# Patient Record
Sex: Male | Born: 1950 | Race: White | Hispanic: No | Marital: Married | Smoking: Never smoker
Health system: Southern US, Academic
[De-identification: ages and names within clinical notes are randomized; demographics above are authoritative.]

## PROBLEM LIST (undated history)

## (undated) DIAGNOSIS — E7800 Pure hypercholesterolemia, unspecified: Secondary | ICD-10-CM

## (undated) DIAGNOSIS — E039 Hypothyroidism, unspecified: Secondary | ICD-10-CM

## (undated) DIAGNOSIS — F419 Anxiety disorder, unspecified: Secondary | ICD-10-CM

## (undated) DIAGNOSIS — G912 (Idiopathic) normal pressure hydrocephalus: Secondary | ICD-10-CM

## (undated) DIAGNOSIS — E785 Hyperlipidemia, unspecified: Secondary | ICD-10-CM

## (undated) DIAGNOSIS — K219 Gastro-esophageal reflux disease without esophagitis: Secondary | ICD-10-CM

## (undated) DIAGNOSIS — E119 Type 2 diabetes mellitus without complications: Secondary | ICD-10-CM

## (undated) HISTORY — DX: Gastro-esophageal reflux disease without esophagitis: K21.9

## (undated) HISTORY — PX: HX TONSILLECTOMY: SHX27

## (undated) HISTORY — DX: Type 2 diabetes mellitus without complications: E11.9

## (undated) HISTORY — DX: Anxiety disorder, unspecified: F41.9

## (undated) HISTORY — PX: HX APPENDECTOMY: SHX54

## (undated) HISTORY — DX: Hyperlipidemia, unspecified: E78.5

## (undated) HISTORY — DX: (Idiopathic) normal pressure hydrocephalus: G91.2

## (undated) HISTORY — DX: Hypothyroidism, unspecified: E03.9

## (undated) HISTORY — PX: HX COLONOSCOPY: 2100001147

## (undated) HISTORY — DX: Pure hypercholesterolemia, unspecified: E78.00

---

## 2001-08-06 ENCOUNTER — Emergency Department: Payer: Self-pay

## 2006-09-26 ENCOUNTER — Ambulatory Visit (INDEPENDENT_AMBULATORY_CARE_PROVIDER_SITE_OTHER): Payer: Medicare Other | Admitting: Neurology

## 2010-01-26 ENCOUNTER — Encounter (HOSPITAL_BASED_OUTPATIENT_CLINIC_OR_DEPARTMENT_OTHER): Payer: Medicare Other

## 2010-03-30 ENCOUNTER — Encounter (INDEPENDENT_AMBULATORY_CARE_PROVIDER_SITE_OTHER): Payer: Medicare Other

## 2010-06-01 ENCOUNTER — Encounter (HOSPITAL_BASED_OUTPATIENT_CLINIC_OR_DEPARTMENT_OTHER): Payer: Medicare Other

## 2010-07-06 ENCOUNTER — Encounter (INDEPENDENT_AMBULATORY_CARE_PROVIDER_SITE_OTHER): Payer: Medicare Other

## 2010-08-10 ENCOUNTER — Encounter (INDEPENDENT_AMBULATORY_CARE_PROVIDER_SITE_OTHER): Payer: Medicare Other

## 2010-09-21 ENCOUNTER — Encounter (INDEPENDENT_AMBULATORY_CARE_PROVIDER_SITE_OTHER): Payer: Medicare Other

## 2010-12-21 ENCOUNTER — Encounter (HOSPITAL_BASED_OUTPATIENT_CLINIC_OR_DEPARTMENT_OTHER): Payer: Medicare Other

## 2011-01-04 ENCOUNTER — Encounter (HOSPITAL_BASED_OUTPATIENT_CLINIC_OR_DEPARTMENT_OTHER): Payer: Self-pay

## 2011-01-04 ENCOUNTER — Ambulatory Visit (INDEPENDENT_AMBULATORY_CARE_PROVIDER_SITE_OTHER): Payer: Medicare Other

## 2011-01-04 NOTE — Patient Instructions (Signed)
 Continue above regimen of medications. Try talking melatonin 2-3 hrs before bedtime. Change Buspar  night dose to evening. Follow sleep hygiene measures and consider psychotherapy. Will discuss CBT vs supportive therapy as options in future. Call for any worsening mood symptoms or any change in overall functioning. Crisis plan 911/ER discussed with pt. RTC x 3 months or sooner if needed. Seen and discussed with Dr. Auston.

## 2011-01-04 NOTE — Progress Notes (Addendum)
Vincent Powell ([id number])  02/26/1951    01/04/2011      S:   Patient presents for follow up of his psychiatric problems. Reports being well on his mood and anxiety. No new symptoms or complaints. Continues to have intermittent episodes of depression and associated sleep problems mainly falling asleep. Reports stress makes his symptoms worse but talking with his pastor helps. Has problems with his wife and is having hard managing his things with her. No suicidal thoughts. No obvious manic or hypomanic symptoms. Dyskinetic symptoms fairly well in control. No new affective symptoms.  Sleep/Appetite/Weight: Same and somewhat better.  Psychiatric ROS: No psychosis. Anxiety about multiple stressors.   Recent stressors: Marital discord.   Side effects to meds: None so far    O:   Awake, Alert, Oriented X 3, cooperative, good eye contact, casual attire, good hygiene and grooming, dressing appropriate, Psychomotor activity normal, Gait normal. Speech fast, normal tone and volume. Mood reported as 'Good'. Affect reactive and restricted but euthymic. TP is logical, coherent and Goal Directed. TC devoid of Delusions/paranoia. No SI/HI reported currently. No AVH or other perceptual disturbances. Memory/Abstraction/Fund of Knowledge Good. Attention and concentration fair. Language/Naming/Repetition good. Executive functioning normal. Insight and Judgment Fair.     ASSESSMENT:  1. Bipolar-I, Depressed, Mild but stable.  2. Anxiety Disorder NOS, Stable      PLAN: Continue above regimen of medications. Change night time melatonin and Buspar to evening at around 7 pm to see if any changes. Sleep hygiene measures to improve dysregulation. CBT vs supportive therapy in future if symptoms continue to persist. Call voicemail for any worsening mood symptoms or any change in overall functioning. Crisis plan 911/ER discussed with pt. RTC x 3 months or sooner if needed.    ATTENDING NOTE:   I saw and examined the patient on 01/04/11.  I reviewed the resident's note.  I agree with the findings and plan of care as documented in the resident's note.   Randolph Bing, MD 01/05/2011, 8:53 AM

## 2011-04-05 ENCOUNTER — Encounter (HOSPITAL_BASED_OUTPATIENT_CLINIC_OR_DEPARTMENT_OTHER): Payer: Medicare Other

## 2011-04-12 ENCOUNTER — Encounter (HOSPITAL_BASED_OUTPATIENT_CLINIC_OR_DEPARTMENT_OTHER): Payer: Self-pay

## 2011-04-12 ENCOUNTER — Ambulatory Visit (INDEPENDENT_AMBULATORY_CARE_PROVIDER_SITE_OTHER): Payer: Medicare Other

## 2011-04-12 VITALS — BP 128/65 | HR 72 | Resp 18 | Ht 70.0 in | Wt 224.0 lb

## 2011-04-12 NOTE — Patient Instructions (Addendum)
Continue above regimen of medications. Can try to wean Valium to off. Supportive psychotherapy each visit for family stress. Call for any worsening mood symptoms or any change in overall functioning. Crisis plan 911/ER discussed with pt. RTC x 3 months or sooner if needed. Seen and discussed with Dr. Daine Gravel

## 2011-04-12 NOTE — Progress Notes (Addendum)
Patient Name: Vincent Powell  (MRN [id number])  Date Of Birth: 06-06-1950  Date of Service: 04/12/2011      S:   Patient presents for follow up of his psychiatric problems. Reports being more anxious and worried about all small things in his life. Small things in everyday life is worrying him and feels anxious about it. He says home situation is not doing well and having lot of arguments with life. He is worried. No sleep and appetite problems. Gained some weight. Able to do ADLs with some difficulty. His motivation and energy are fairly well, not down or depressed. Also having some memory deficits overall. Still able to do daily chores without any problems. No obvious mania or psychosis. No SI/HI.   Sleep/Appetite/Weight: Unchanged but able to   Psychiatric ROS: Anxiety present. No Nightmares.  Recent stressors: Interpersonal problems with spouse.  Side effects to meds: None    O:  BP 128/65   Pulse 72   Resp 18   Ht 1.778 m (5\' 10" )   Wt 101.606 kg (224 lb)   BMI 32.14 kg/m2  Awake, Alert, Oriented X 3, cooperative, good eye contact, casual attire, good hygiene and grooming, dressing appropriate, Psychomotor activity normal, Gait normal. Speech normal rate, tone and volume. Mood reported as 'Fair'. Affect reactive and restricted but euthymic. TP is logical, coherent and Goal Directed. TC devoid of Delusions/paranoia. No SI/HI reported currently. No AVH or other perceptual disturbances. Memory/Abstraction/Fund of Knowledge Good. Attention and concentration fair. Language/Naming/Repetition good. Executive functioning normal. Insight and Judgment Fair.     ASSESSMENT:  1. Bipolar-I, Depressed, Mild but stable.   2. Anxiety Disorder NOS, Stable    Current Outpatient Prescriptions   Medication Sig   . Lamotrigine (LAMICTAL) 200 mg Oral Tablet take 200 mg by mouth Once a day.     . busPIRone (BUSPAR) 15 mg Oral Tablet take 15 mg by mouth Three times a day.      . diazepam (VALIUM) 5 mg Oral Tablet take 5 mg by mouth every night.     . Melatonin 3 mg Oral Tablet take 9 mg by mouth every night.     . vitamin E 400 unit Oral Capsule take 400 Units by mouth Twice daily.           PLAN: Continue above regimen of medications. Can try to decrease and keep Valium as PRN dosing for sleep and muscle spasm. Validated current psychosocial situation with wife and offered supportive psychotherapy. Doing well talking with pastor.  Call voicemail for any worsening mood symptoms or any change in overall functioning. Crisis plan 911/ER discussed with pt. RTC x 3 months or sooner if needed.        I saw and examined the patient on 04/12/2011 with Willa Rough, MD.    I reviewed the resident's note.  I agree with the findings including assessment and plan as documented in the resident's note.  Any exceptions/additions are noted .    Improved adaptive coping with life stressors.More self confidant.mood stable.  Darcel Bayley, MD 04/13/2011, 4:22 PM

## 2011-05-30 ENCOUNTER — Other Ambulatory Visit (HOSPITAL_BASED_OUTPATIENT_CLINIC_OR_DEPARTMENT_OTHER): Payer: Self-pay

## 2011-05-30 MED ORDER — LAMOTRIGINE 200 MG TABLET
200.0000 mg | ORAL_TABLET | Freq: Every day | ORAL | Status: DC
Start: 2011-05-30 — End: 2011-08-16

## 2011-05-30 MED ORDER — BUSPIRONE 15 MG TABLET
15.0000 mg | ORAL_TABLET | Freq: Three times a day (TID) | ORAL | Status: DC
Start: 2011-05-30 — End: 2011-06-21

## 2011-05-30 NOTE — Telephone Encounter (Signed)
Called by patient for refill on Lamictal and Buspar. Called in 6 month supply and left message on pts voicemail.

## 2011-06-14 ENCOUNTER — Other Ambulatory Visit (HOSPITAL_BASED_OUTPATIENT_CLINIC_OR_DEPARTMENT_OTHER): Payer: Self-pay

## 2011-06-14 MED ORDER — DIAZEPAM 5 MG TABLET
5.0000 mg | ORAL_TABLET | Freq: Every evening | ORAL | Status: DC | PRN
Start: 2011-06-14 — End: 2011-08-16

## 2011-06-14 NOTE — Telephone Encounter (Signed)
Called in Valium 5 mg qhs prn for spasm and anxiety/sleep. Asked pt not to take it routinely if possible.

## 2011-06-21 ENCOUNTER — Encounter (HOSPITAL_BASED_OUTPATIENT_CLINIC_OR_DEPARTMENT_OTHER): Payer: Self-pay

## 2011-06-21 ENCOUNTER — Ambulatory Visit (INDEPENDENT_AMBULATORY_CARE_PROVIDER_SITE_OTHER): Payer: Medicare Other

## 2011-06-21 VITALS — BP 124/74 | HR 74 | Resp 18 | Ht 70.0 in | Wt 221.0 lb

## 2011-06-21 MED ORDER — BUSPIRONE 30 MG TABLET
30.0000 mg | ORAL_TABLET | Freq: Two times a day (BID) | ORAL | Status: DC
Start: 2011-06-21 — End: 2011-10-18

## 2011-06-21 NOTE — Progress Notes (Addendum)
Patient Name: Vincent Powell  (MRN [id number])  Date Of Birth: Jul 30, 1950  Date of Service: 06/21/2011      S:   Patient presents for follow up of his psychiatric problems. Reports being well on his mood and anxiety. No new symptoms or complaints. [name] reports having some depression with associated memory and concentration problems. He reports forgetting names and sometimes reasons of some activities. He is not having any bowel/bladder problems. He reports having good energy, able to go out and do lawn work. Talks with his pastor about problems. He has marital problems with his wife. His relationship with her is strained. Feels he cannot fix that part of his life. No SI/HI. Has random racing thoughts in the night but able to get 5-6 hours sleep eventually.  Sleep/Appetite/Weight: Appetite and weight unchanged good.  Psychiatric ROS: Anxiety present but no physical symptoms associated with it.  Recent stressors: Marital discord  Side effects to meds: None so far    O:  BP 124/74   Pulse 74   Resp 18   Ht 1.778 m (5\' 10" )   Wt 100.245 kg (221 lb)   BMI 31.71 kg/m2  Awake, Alert, Oriented X 3, cooperative, good eye contact, casual attire, good hygiene and grooming, dressing appropriate, Psychomotor activity normal, Gait normal. Speech normal rate, tone and volume. Mood reported as 'Good'. Affect reactive and euthymic. TP is logical, coherent and Goal Directed. TC devoid of Delusions/paranoia. No SI/HI reported currently. No AVH or other perceptual disturbances. Memory/Abstraction/Fund of Knowledge Good. Attention and concentration fair to poor. Language/Naming/Repetition good. Executive functioning normal. Insight and Judgment Fair. MMSE today 25/30 with trouble with recollecting 2/3 and attention problems.    ASSESSMENT:  1. Anxiety disorder (300.00)  busPIRone (BUSPAR) 30 mg Oral Tablet   2. Bipolar 1 disorder, depressed, mild (296.51)     3. 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 Mild cognitive impairment (331.83)  VITAMIN B12, FOLATE, THYROID STIMULATING HORMONE (SENSITIVE TSH), VITAMIN B12, FOLATE, THYROID STIMULATING HORMONE (SENSITIVE TSH)     Current Outpatient Prescriptions   Medication Sig   . busPIRone (BUSPAR) 30 mg Oral Tablet Take 1 Tab (30 mg total) by mouth Twice daily   . diazepam (VALIUM) 5 mg Oral Tablet Take 1 Tab (5 mg total) by mouth Every night as needed for Anxiety   . Lamotrigine (LAMICTAL) 200 mg Oral Tablet Take 1 Tab (200 mg total) by mouth Once a day   . DISCONTD: busPIRone (BUSPAR) 15 mg Oral Tablet Take 1 Tab (15 mg total) by mouth Three times a day   . Melatonin 3 mg Oral Tablet take 9 mg by mouth every night.     . vitamin E 400 unit Oral Capsule take 400 Units by mouth Twice daily.           PLAN: Continue above regimen of medications. MMSE stable between last visit and now. Pt feels it is going down. Will assess for B12, folate and TSH. Low threshold to start cholinesterase inhibitors if worsening over next few visits. MMSE every visit. Call voicemail for any worsening mood symptoms or any change in overall functioning. Crisis plan 911/ER discussed with pt. RTC x 2 months or sooner if needed.          I saw and examined the patient on 06/21/2011.  I reviewed the resident's note.  I agree with the findings and plan of care as documented in the resident's note.  Any exceptions/additions are edited/noted.  Clinton Quant, MD 06/22/2011, 8:13 AM

## 2011-06-21 NOTE — Patient Instructions (Signed)
 Continue above regimen of medications. Change Buspar  frequency to BID with keeping total dose same per pt request. Check B12, Folate and TSH for memory and concentration decline. MMSE every visit. Call for any worsening mood symptoms or any change in overall functioning. Crisis plan 911/ER discussed with pt. RTC x 2 months or sooner if needed. Seen and discussed with Dr. Arvella.

## 2011-07-17 ENCOUNTER — Telehealth (HOSPITAL_BASED_OUTPATIENT_CLINIC_OR_DEPARTMENT_OTHER): Payer: Self-pay

## 2011-07-17 NOTE — Telephone Encounter (Signed)
Pt reports that he has been having worsening depression and feeling really down. Interrupted but adequate sleep. Appetite normal. Energy and memory poor. Overall worst since last 3-4 years. Not SI now. Commits to safety. Discussed importance to going to ER or call 911 if SI. Pt understands, will consider using low dose abilify if not improving. Will also consider switching from Buspar to SNRI like cymbalta in future.

## 2011-08-16 ENCOUNTER — Ambulatory Visit (INDEPENDENT_AMBULATORY_CARE_PROVIDER_SITE_OTHER): Payer: Medicare Other

## 2011-08-16 ENCOUNTER — Encounter (HOSPITAL_BASED_OUTPATIENT_CLINIC_OR_DEPARTMENT_OTHER): Payer: Self-pay

## 2011-08-16 VITALS — BP 132/77 | HR 65 | Resp 18 | Ht 70.0 in | Wt 220.0 lb

## 2011-08-16 MED ORDER — DIAZEPAM 5 MG TABLET
5.0000 mg | ORAL_TABLET | Freq: Every evening | ORAL | Status: DC
Start: 2011-08-16 — End: 2011-11-22

## 2011-08-16 MED ORDER — MELATONIN 3 MG TABLET
9.0000 mg | ORAL_TABLET | Freq: Every evening | ORAL | Status: DC
Start: 2011-08-16 — End: 2012-11-05

## 2011-08-16 MED ORDER — LAMOTRIGINE 150 MG TABLET
150.0000 mg | ORAL_TABLET | Freq: Every day | ORAL | Status: DC
Start: 2011-08-16 — End: 2011-11-22

## 2011-08-16 MED ORDER — LAMOTRIGINE 100 MG TABLET
100.00 mg | ORAL_TABLET | Freq: Every evening | ORAL | Status: DC
Start: 2011-08-16 — End: 2011-12-20

## 2011-08-16 NOTE — Patient Instructions (Signed)
Continue above regimen of medications. Increase Lamictal 150 mg in AM and 100mg  qhs. Continue diazepam 5 mg qhs and call for any problems. Call for any worsening mood symptoms or any change in overall functioning. Crisis plan 911/ER discussed with pt. RTC x 3 weeks- 1 months or sooner if needed. Seen and discussed with Dr.

## 2011-08-16 NOTE — Progress Notes (Addendum)
Patient Name: Vincent Powell  (MRN [id number])  Date Of Birth: [birth year]  Date of Service: 08/16/2011      S:   Patient presents for follow up of his psychiatric problems. Mr Tiley continues to be down on his mood for a few days and in between noticed to have some increased anxiety and racing thoughts. Difficulty with calming down and perseverates on finishing something. Energy levels fairly good. Reports that mother has been recently diagnosed with terminal cancer and in the process of making her comfort care. Doing well at home but still has good amount of friction with wife. Able to walk around fairly well 1-2 miles per day most days. Sleeping well. No appetite or weight problems. Feels memory is fairly poor and having concentration problems. No pain problems. Pt reports getting weaker on swallow after the initial few weeks of botox injections. Now better.   Sleep/Appetite/Weight: As above  Psychiatric ROS: No mania/psychosis/No nightmares. Anxiety present. No panic attacks.  Recent stressors: Mothers cancer, relationship with wife.  Side effects to meds: None    O:  BP 132/77   Pulse 65   Resp 18   Ht 1.778 m (5\' 10" )   Wt 99.791 kg (220 lb)   BMI 31.57 kg/m2  Awake, Alert, Oriented X 3, cooperative, good eye contact, casual attire, good hygiene and grooming, dressing appropriate, Psychomotor activity normal, Gait normal. Speech normal rate, tone and volume. Mood reported as 'Good'. Affect reactive and restricted but euthymic. TP is logical, coherent and Goal Directed. TC devoid of Delusions/paranoia. No SI/HI reported currently. No AVH or other perceptual disturbances. Memory/Abstraction/Fund of Knowledge Good. Attention and concentration fair. Language/Naming/Repetition good. Executive functioning normal. Insight and Judgment Fair.     ASSESSMENT:   1. Bipolar 1 disorder, depressed, mild (296.51)  Lamotrigine (LAMICTAL) 150 mg Oral Tablet, lamoTRIgine (LAMICTAL) 100 mg Oral Tablet, Melatonin 3 mg Oral Tablet, diazepam (VALIUM) 5 mg Oral Tablet   2. Anxiety disorder (300.00)  diazepam (VALIUM) 5 mg Oral Tablet   3. 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 Lamotrigine (LAMICTAL) 150 mg Oral Tablet Take 1 Tab (150 mg total) by mouth Once a day   . lamoTRIgine (LAMICTAL) 100 mg Oral Tablet Take 1 Tab (100 mg total) by mouth Every night   . Melatonin 3 mg Oral Tablet Take 3 Tabs (9 mg total) by mouth Every night   . diazepam (VALIUM) 5 mg Oral Tablet Take 1 Tab (5 mg total) by mouth Every night   . busPIRone (BUSPAR) 30 mg Oral Tablet Take 1 Tab (30 mg total) by mouth Twice daily   . DISCONTD: diazepam (VALIUM) 5 mg Oral Tablet Take 1 Tab (5 mg total) by mouth Every night as needed for Anxiety   . DISCONTD: Lamotrigine (LAMICTAL) 200 mg Oral Tablet Take 1 Tab (200 mg total) by mouth Once a day   . vitamin E 400 unit Oral Capsule take 400 Units by mouth Twice daily.     Marland Kitchen DISCONTD: Melatonin 3 mg Oral Tablet take 9 mg by mouth every night.           PLAN: Continue above regimen of medications. Increase Lamictal to 150 qam and 100 qhs. Watch for rash and dizziness. Continue Valium at night on a regular basis. If not improving next visit will try a serotonergic agent or Wellbutrin. Will avoid any type of atypical antipsychotics. Call voicemail for any worsening mood symptoms  or any change in overall functioning. Crisis plan 911/ER discussed with pt. RTC x 3 weeks or sooner if needed.      I saw and examined the patient on 08/16/2011.  I reviewed the resident's note.  I agree with the findings including assessment and plan as documented in the resident's note.      Alveda Reasons. Milinda Cave, MD 08/17/2011, 10:07 AM

## 2011-09-12 ENCOUNTER — Encounter (HOSPITAL_BASED_OUTPATIENT_CLINIC_OR_DEPARTMENT_OTHER): Payer: Medicare Other

## 2011-10-18 ENCOUNTER — Ambulatory Visit (INDEPENDENT_AMBULATORY_CARE_PROVIDER_SITE_OTHER): Payer: Medicare Other

## 2011-10-18 VITALS — BP 117/58 | HR 65 | Resp 18

## 2011-10-18 MED ORDER — BUSPIRONE 30 MG TABLET
30.0000 mg | ORAL_TABLET | Freq: Two times a day (BID) | ORAL | Status: DC
Start: 2011-10-18 — End: 2012-03-06

## 2011-10-18 MED ORDER — VITAMIN E (DL, ACETATE) 180 MG (400 UNIT) CAPSULE
400.0000 [IU] | ORAL_CAPSULE | Freq: Two times a day (BID) | ORAL | Status: DC
Start: 2011-10-18 — End: 2022-12-07

## 2011-10-18 NOTE — Patient Instructions (Signed)
Continue above regimen of medications. Clarify lamictal dose with pharmacy and keep the 250 mg daily. Call for any worsening mood symptoms or any change in overall functioning. Crisis plan 911/ER discussed with pt. RTC x 1 month or sooner if needed. Seen and discussed with Dr. Daine Gravel.

## 2011-10-18 NOTE — Progress Notes (Addendum)
 Patient Name: Vincent Powell  (MRN [id number])  Date Of Birth: Jan 18, 1951  Date of Service: 10/18/2011    1. Anxiety disorder (300.00)  busPIRone  (BUSPAR ) 30 mg Oral Tablet   2. Bipolar 1 disorder, depressed, mild (296.51)     3. 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         S:   Patient presents for follow up of his psychiatric problems. Says he is not doing too well. His mood has been down and feels more stressed out and anxious last few weeks. He says there was a confusion at the pharmacy and ended up on a lower dose of the lamictal  than he usually takes. He started to take only 150 mg daily. He says he forgot to call us  about it. His mother is going through palliative radiation for her metastatic breast cancer and her mother in law is suffering from end stage dementia. He is needing to shuttle around different houses and places to get things rolling and he is pretty stressed out about this in many ways. He is feeling like he may need to take the hospice route for his mother like they did for his father. He feels sad about it but known that's what he has to do. Sleeping well at mother's place but gets up some nights at his house checking for various things. No SI. No major problems otherwise noted.    Sleep/Appetite/Weight: Unchanged and good.  Psychiatric ROS: feels like going back on valium  helped with his TD symptoms.   Recent stressors:  Mother's end stage cancer, mother-in-law with dementia, interpersonal conflict with wife.  Side effects to medications:  None so far      O:  BP 117/58  Pulse 65  Resp 18  Awake, Alert, Oriented X 3, cooperative, good eye contact, casual attire, good hygiene and grooming, dressing appropriate, Psychomotor activity increased, Gait normal. Speech normal rate, tone and volume. Mood reported as 'down'. Affect reactive and restricted, dysthymic. TP is logical, coherent and Goal Directed. TC devoid of Delusions/paranoia. No SI/HI reported currently. No AVH or other perceptual disturbances. Memory/Abstraction/Fund of Knowledge Good. Attention and concentration fair. Language/Naming/Repetition good. Executive functioning normal. Insight and Judgment Fair.     ASSESSMENT:  1. Bipolar disorder - I, currently depressed, Moderate, No SI  2. Neuroleptic induced Tardive dyskinesia   3. GAD, somewhat increasing anxiety    DSM-5  1. Bipolar disorder - I, currently depressed, Moderate, No SI  2. Neuroleptic induced Tardive dyskinesia   3. GAD, somewhat increasing anxiety    PLAN: Continue above regimen of medications. Go back to lamotrigine  250 mg daily in divided doses. Clarified with pharmacy and patient the regimen. Supportive therapy each session. Call voicemail for any worsening mood symptoms or any change in overall functioning. Crisis plan 911/ER discussed  with pt. RTC x 1 month or sooner if needed. Seen and discussed with Dr Evan.      I saw and examined the patient on 10/18/2011 with Alejandro Flores, MD.    I reviewed the resident's note.  I agree with the findings including assessment and plan as documented in the resident's note.  Any exceptions/additions are noted .Overall coping well with family stressors      Wilkie Evan, MD 10/22/2011, 1:03 PM

## 2011-11-22 ENCOUNTER — Encounter (HOSPITAL_BASED_OUTPATIENT_CLINIC_OR_DEPARTMENT_OTHER): Payer: Medicare Other

## 2011-11-22 ENCOUNTER — Ambulatory Visit (INDEPENDENT_AMBULATORY_CARE_PROVIDER_SITE_OTHER): Payer: Medicare Other | Admitting: Internal Medicine

## 2011-11-22 VITALS — BP 134/82 | HR 78 | Wt 216.0 lb

## 2011-11-22 MED ORDER — DIAZEPAM 5 MG TABLET
5.0000 mg | ORAL_TABLET | Freq: Every evening | ORAL | Status: DC
Start: 2011-11-22 — End: 2012-03-06

## 2011-11-22 MED ORDER — LAMOTRIGINE 150 MG TABLET
150.0000 mg | ORAL_TABLET | Freq: Every day | ORAL | Status: DC
Start: 2011-11-22 — End: 2011-12-20

## 2011-11-25 ENCOUNTER — Encounter (HOSPITAL_BASED_OUTPATIENT_CLINIC_OR_DEPARTMENT_OTHER): Payer: Self-pay | Admitting: Internal Medicine

## 2011-11-25 NOTE — Patient Instructions (Signed)
 Take all meds as prescribed. Call Dr. Kodali, if you have any questions. Call 911 or go to nearest ER in any crisis.

## 2011-11-25 NOTE — Progress Notes (Addendum)
Patient Name: Vincent Powell (MRN [id number])   Date Of Birth: 03/20/1950   Date of Service: 11/22/2011   1.  Anxiety disorder (300.00)     2.  Bipolar 1 disorder, depressed, mild (296.51)     3.  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 Says he is not doing too well. His mood has been down and feels more stressed out and anxious last few week.His mother is going through palliative radiation for her metastatic breast cancer and her mother in law is suffering from end stage dementia. He is needing to shuttle around different houses and places to get things rolling and he is pretty stressed out about this in many ways. He is feeling like he may need to take the hospice route for his mother like they did for his father. He feels sad about it but known that's what he has to do. Sleeping well at mother's place but gets up some nights at his house checking for various things. No SI. No major problems otherwise noted. Pt is back on Valium, states, it has decreased his involuntary movements.  Sleep/Appetite/Weight: Unchanged and good.    Recent stressors: Mother's end stage cancer, mother-in-law with dementia, interpersonal conflict with wife.   Side effects to medications: None so far   O: BP 117/58   Pulse 65   Resp 18   Awake, Alert, Oriented X 3, cooperative, good eye contact, casual attire, good hygiene and grooming, dressing appropriate, Psychomotor activity increased, Gait normal. Speech normal rate, tone and volume. Mood reported as 'down'. Affect reactive and restricted, dysthymic. TP is logical, coherent and Goal Directed. TC devoid of Delusions/paranoia. No SI/HI reported currently. No AVH or other perceptual disturbances. Memory/Abstraction/Fund of Knowledge Good. Attention and concentration fair. Language/Naming/Repetition good. Executive functioning normal. Insight and Judgment Fair.   ASSESSMENT:    1. Bipolar disorder - I, currently depressed, Moderate, No SI   2. Neuroleptic induced Tardive dyskinesia   3. GAD, somewhat increasing anxiety   DSM-5   1. Bipolar disorder - I, currently depressed, Moderate, No SI   2. Neuroleptic induced Tardive dyskinesia   3. GAD, somewhat increasing anxiety   PLAN: Continue above regimen of medications. Increase Lamictal by 50 mg. 200 mg daily in AM and 100 mg at night. Add vistaril 25 mg 4 times a day as needed. Supportive therapy each session. Call voicemail for any worsening mood symptoms or any change in overall functioning. Crisis plan 911/ER discussed with pt. RTC x 1 month or sooner if needed. Seen and discussed with Dr Daine Gravel.    I saw and examined the patient on 11/22/2011 with Berton Mount, MD.    I reviewed the resident's note.  I agree with the findings including assessment and plan as documented in the resident's note.  Any exceptions/additions are noted .Responded well to supportive therapy.      Darcel Bayley, MD 11/26/2011, 2:36 PM

## 2011-12-20 ENCOUNTER — Ambulatory Visit (INDEPENDENT_AMBULATORY_CARE_PROVIDER_SITE_OTHER): Payer: Medicare Other

## 2011-12-20 ENCOUNTER — Encounter (HOSPITAL_BASED_OUTPATIENT_CLINIC_OR_DEPARTMENT_OTHER): Payer: Self-pay

## 2011-12-20 VITALS — BP 107/78 | HR 73 | Resp 18 | Wt 217.0 lb

## 2011-12-20 MED ORDER — LAMOTRIGINE 150 MG TABLET
150.0000 mg | ORAL_TABLET | Freq: Every day | ORAL | Status: DC
Start: 2011-12-20 — End: 2012-03-06

## 2011-12-20 NOTE — Progress Notes (Addendum)
 Patient Name: Vincent Powell  (MRN [id number])  Date Of Birth: [birth year]  Date of Service: 12/20/2011    1. Bipolar 1 disorder, depressed, mild (296.51)  Lamotrigine  (LAMICTAL ) 150 mg Oral Tablet   2. Anxiety disorder (300.00)     3. 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)         Outpatient Prescriptions Prior to Visit:  busPIRone  (BUSPAR ) 30 mg Oral Tablet Active Take 1 Tab (30 mg total) by mouth Twice daily   diazepam  (VALIUM ) 5 mg Oral Tablet Active Take 1 Tab (5 mg total) by mouth Every night   Melatonin 3 mg Oral Tablet Active Take 3 Tabs (9 mg total) by mouth Every night   vitamin E  400 unit Oral Capsule Active Take 1 Cap (400 Units total) by mouth Twice daily   lamoTRIgine  (LAMICTAL ) 100 mg Oral Tablet Discontinued Take 1 Tab (100 mg total) by mouth Every night   Lamotrigine  (LAMICTAL ) 150 mg Oral Tablet Discontinued Take 1 Tab (150 mg total) by mouth Once a day     No facility-administered medications prior to visit.    S:   Patient presents for follow up of his psychiatric problems. Reports being well on his mood and anxiety. No new symptoms or complaints. Doing better since last visit. Did not change his medication dose as advised by Dr Jestine but still getting better over the last few weeks. Still having problem with his mother who is suffering from metastatic bone cancer and pain related to that. Most of his time is spent in taking care of his mother. His mother in law is also not doing too well with her dementia. He still is able to talk to his pastor and get some support. His wife is going to retire soon next few weeks and he feels he will be better on support after that. Overall no new problems or concerns. No SI/HI. No mania/hypomania.  Sleep/Appetite/Weight: Improved sleep. Appetite same. Weight slightly up.  Psychiatric ROS: No nightmares. No psychosis.   Recent stressors: Family stressors - multiple.  Side effects to medications: None      O:  BP 107/78  Pulse 73  Resp 18   Wt 98.431 kg (217 lb)  BMI 31.14 kg/m2  Awake, Alert, Oriented X 3, cooperative, good eye contact, casual attire, good hygiene and grooming, dressing appropriate, Psychomotor activity slightly increased, Gait normal. Speech normal rate, tone and volume. Mood reported as 'anxious'. Affect reactive and restricted but euthymic. TP is logical, coherent and Goal Directed. TC devoid of Delusions/paranoia. No SI/HI reported currently. No AVH or other perceptual disturbances. Memory/Abstraction/Fund of Knowledge Good. Attention and concentration fair. Language/Naming/Repetition good. Executive functioning normal. Insight and Judgment Fair.     ASSESSMENT:  1. Bipolar disorder-I, worse with minimal residual depression and anxiety  2. Generalized anxiety disorder, worsening with family stressors      PLAN: Continue above regimen of medications. Increase Lamictal  150 BID and watch for improvement. Continue supportive therapy as able. Call voicemail for any worsening mood symptoms or any change in overall functioning. Crisis plan 911/ER discussed with pt. RTC x 2 months or sooner if needed. Seen and discussed with Dr Horris.        Behavioral Medicine Attending:    Seen, Examined by me on 12/20/2011 , I have reviewed the resident's note. I agree with the findings and plan of care as documented in the resident's note. Any exceptions/additions are edited/noted.    Colin Horris, MD 12/24/2011, 10:10 AM

## 2011-12-20 NOTE — Patient Instructions (Signed)
Continue above regimen of medications. Increase Lamictal 150 BID. Call for any worsening mood symptoms or any change in overall functioning. Crisis plan 911/ER discussed with pt. RTC x 2 months or sooner if needed. Seen and discussed with Dr. Delight Hoh.

## 2012-03-06 ENCOUNTER — Ambulatory Visit (INDEPENDENT_AMBULATORY_CARE_PROVIDER_SITE_OTHER): Payer: Medicare Other

## 2012-03-06 ENCOUNTER — Encounter (HOSPITAL_BASED_OUTPATIENT_CLINIC_OR_DEPARTMENT_OTHER): Payer: Self-pay

## 2012-03-06 VITALS — BP 124/65 | HR 61 | Resp 18 | Ht 70.0 in | Wt 219.0 lb

## 2012-03-06 MED ORDER — LAMOTRIGINE 150 MG TABLET
150.0000 mg | ORAL_TABLET | Freq: Every day | ORAL | Status: DC
Start: 2012-03-06 — End: 2012-09-03

## 2012-03-06 MED ORDER — BUSPIRONE 30 MG TABLET
30.0000 mg | ORAL_TABLET | Freq: Two times a day (BID) | ORAL | Status: DC
Start: 2012-03-06 — End: 2012-09-03

## 2012-03-06 MED ORDER — DIAZEPAM 5 MG TABLET
5.0000 mg | ORAL_TABLET | Freq: Every evening | ORAL | Status: DC
Start: 2012-03-06 — End: 2012-08-07

## 2012-03-06 NOTE — Patient Instructions (Signed)
 Continue above regimen of medications. Supportive therapy for current stressors. Pt maintaining health well. Call for any worsening mood symptoms or any change in overall functioning. Crisis plan 911/ER discussed with pt. RTC x 3 months or sooner if needed. Seen and discussed with Dr. Izetta

## 2012-07-03 ENCOUNTER — Ambulatory Visit (INDEPENDENT_AMBULATORY_CARE_PROVIDER_SITE_OTHER): Payer: Medicare Other

## 2012-07-03 VITALS — BP 136/67 | HR 73 | Resp 18 | Ht 70.0 in | Wt 226.7 lb

## 2012-07-03 MED ORDER — BUPROPION HCL XL 150 MG 24 HR TABLET, EXTENDED RELEASE
150.00 mg | ORAL_TABLET | Freq: Every morning | ORAL | Status: DC
Start: 2012-07-03 — End: 2012-08-07

## 2012-07-10 ENCOUNTER — Encounter (HOSPITAL_BASED_OUTPATIENT_CLINIC_OR_DEPARTMENT_OTHER): Payer: Medicare Other

## 2012-08-07 ENCOUNTER — Ambulatory Visit (INDEPENDENT_AMBULATORY_CARE_PROVIDER_SITE_OTHER): Payer: Medicare Other

## 2012-08-07 VITALS — BP 114/74 | HR 70 | Resp 18 | Ht 70.0 in | Wt 226.8 lb

## 2012-08-07 MED ORDER — DIAZEPAM 5 MG TABLET
5.0000 mg | ORAL_TABLET | Freq: Every evening | ORAL | Status: DC
Start: 2012-08-07 — End: 2012-08-25

## 2012-08-07 MED ORDER — BUPROPION HCL XL 150 MG 24 HR TABLET, EXTENDED RELEASE
300.0000 mg | ORAL_TABLET | Freq: Every morning | ORAL | Status: DC
Start: 2012-08-07 — End: 2012-08-25

## 2012-08-07 NOTE — Patient Instructions (Addendum)
Continue above regimen of medications. Increase Buproprion XL 300 mg daily. Discussed slow response of anti depressants in older patients. Watch S/E. Call for any worsening mood symptoms or any change in overall functioning. Crisis plan 911/ER discussed with pt. RTC x 1 months or sooner if needed. Seen and discussed with Dr. Milinda Cave.

## 2012-08-15 NOTE — Progress Notes (Addendum)
Patient Name: Vincent Powell  (MRN [id number])  Date Of Birth: 25-Apr-1950  Date of Service: 08/07/2012      ICD-9-CM    1. Bipolar 1 disorder, depressed, mild 296.51 buPROPion (BUPEPRION XL) 150 mg Oral Tablet Sustained Release 24 hr     diazepam (VALIUM) 5 mg Oral Tablet   2. Anxiety disorder 300.00 buPROPion (BUPEPRION XL) 150 mg Oral Tablet Sustained Release 24 hr     diazepam (VALIUM) 5 mg Oral Tablet       Outpatient Prescriptions Prior to Visit:  busPIRone (BUSPAR) 30 mg Oral Tablet Take 1 Tab (30 mg total) by mouth Twice daily   Lamotrigine (LAMICTAL) 150 mg Oral Tablet Take 1 Tab (150 mg total) by mouth Once a day   Melatonin 3 mg Oral Tablet Take 3 Tabs (9 mg total) by mouth Every night   vitamin E 400 unit Oral Capsule Take 1 Cap (400 Units total) by mouth Twice daily   buPROPion (BUPEPRION XL) 150 mg Oral Tablet Sustained Release 24 hr Take 1 Tab (150 mg total) by mouth Every morning   diazepam (VALIUM) 5 mg Oral Tablet Take 1 Tab (5 mg total) by mouth Every night     No facility-administered medications prior to visit.    S:   Patient presents for follow up of his psychiatric problems. Vincent Powell comes for his next follow up visit after starting on Buproprion about 6 weeks ago. Pt reports not a lot of benefit from the medication. He does not feel that there is a great deal of change in his mood status. He still feels that mood is really down, depressed, poor energy, lot of anxiety and difficulty with memory and concentration. He is having good appetite and good sleep. He still has memories of his mother who passed away. Got himself set up for counseling with Hospice program and is hoping that this would help him cope better. Anxiety with is wife is also on the high and feels like that has stressed him out a lot too. No SI/HI. No mania/psychosis.  Sleep/Appetite/Weight: Unchanged and fair  Psychiatric ROS: Anxiety and panic attacks occasionally  Recent stressors: none new   Side effects to medications: None       O:  BP 114/74   Pulse 70   Resp 18   Ht 1.778 m (5\' 10" )   Wt 102.876 kg (226 lb 12.8 oz)   BMI 32.54 kg/m2  Awake, Alert, Oriented X 3, cooperative, good eye contact, casual attire, good hygiene and grooming, dressing appropriate, Psychomotor activity normal, Gait normal. Speech normal rate, tone and volume. Mood reported as 'depressed'. Affect reactive and restricted but dysthymic. TP is logical, coherent and Goal Directed. TC devoid of Delusions/paranoia. No SI/HI reported currently. No AVH or other perceptual disturbances. Memory/Abstraction/Fund of Knowledge Good. Attention and concentration fair. Language/Naming/Repetition good. Executive functioning normal. Insight and Judgment Fair.     ASSESSMENT:  1. Bipolar type-I, currently Major depressive episode  2. GAD, no improvement      PLAN: Continue above regimen of medications. Discussed the slow response of medications in elderly population. Increase Buproprion XL 300 mg daily and follow up in 2-3 weeks. Call voicemail for any worsening mood symptoms or any change in overall functioning. Crisis plan 911/ER discussed with pt. RTC x 1 months or sooner if needed. Seen and discussed with Dr Milinda Cave.          I saw and examined the patient on 08/07/2012.  I reviewed the resident's note.  I agree with the findings including assessment and plan as documented in the resident's note.      Alveda Reasons. Milinda Cave, MD 08/18/2012, 1:50 PM

## 2012-08-25 ENCOUNTER — Other Ambulatory Visit (HOSPITAL_BASED_OUTPATIENT_CLINIC_OR_DEPARTMENT_OTHER): Payer: Self-pay

## 2012-08-25 MED ORDER — DIAZEPAM 5 MG TABLET
10.0000 mg | ORAL_TABLET | Freq: Every evening | ORAL | Status: DC
Start: 2012-08-25 — End: 2012-12-17

## 2012-08-25 NOTE — Telephone Encounter (Signed)
 Pt called saying that his depression is not any better and he is trying to take it everyday with no improvement. He had a day with hm staying in bed all morning and not getting up. His mood was down, his energy is low, poor sleep and poor concentration and memory. He apparently had blood work done last month which was all normal. No imaging was done though. We will try to get records of the blood work. I will stop the wellbutrin . Will tryt o increasde valium  10 mg to help with sleep. Will call to heck on him on Thursday and if still not better on  Mood, will try him on lexapro . He has an appt with me in a week.

## 2012-08-28 ENCOUNTER — Other Ambulatory Visit (HOSPITAL_BASED_OUTPATIENT_CLINIC_OR_DEPARTMENT_OTHER): Payer: Self-pay

## 2012-08-28 MED ORDER — ESCITALOPRAM 10 MG TABLET
10.0000 mg | ORAL_TABLET | Freq: Every day | ORAL | Status: DC
Start: 2012-08-28 — End: 2012-10-20

## 2012-08-28 NOTE — Telephone Encounter (Signed)
 Called pt to verify how he is doing. Sleep much better with the extra dose of valium . Now upto 10 mg qhs. Still feels low and moody. WIll try low dose lexapro  10 mg daily and see if we can find a benefit for his anxiety and mood problems. May need to change mood stabilizer in future if not very beneficial.

## 2012-09-03 ENCOUNTER — Ambulatory Visit (INDEPENDENT_AMBULATORY_CARE_PROVIDER_SITE_OTHER): Payer: Medicare Other

## 2012-09-03 VITALS — BP 110/68 | HR 66 | Resp 18 | Ht 70.0 in | Wt 226.0 lb

## 2012-09-03 MED ORDER — BUSPIRONE 30 MG TABLET
30.0000 mg | ORAL_TABLET | Freq: Two times a day (BID) | ORAL | Status: DC
Start: 2012-09-03 — End: 2013-02-04

## 2012-09-03 MED ORDER — LAMOTRIGINE 150 MG TABLET
150.0000 mg | ORAL_TABLET | Freq: Every day | ORAL | Status: DC
Start: 2012-09-03 — End: 2013-02-04

## 2012-09-03 NOTE — Patient Instructions (Addendum)
Continue above regimen of medications. Watch for S/E. Follow with supportive therapy at each appointments. Call for any worsening mood symptoms or any change in overall functioning. Crisis plan 911/ER discussed with pt. RTC x 2 months or sooner if needed. Seen and discussed with Dr. Daine Gravel

## 2012-09-04 ENCOUNTER — Encounter (HOSPITAL_BASED_OUTPATIENT_CLINIC_OR_DEPARTMENT_OTHER): Payer: Medicare Other

## 2012-09-05 NOTE — Progress Notes (Addendum)
Patient Name: Vincent Powell  (MRN [id number])  Date Of Birth: 01-17-1951  Date of Service: 09/03/2012      ICD-9-CM    1. Bipolar 1 disorder, depressed, mild 296.51 LamoTRIgine (LAMICTAL) 150 mg Oral Tablet   2. Anxiety disorder 300.00 busPIRone (BUSPAR) 30 mg Oral Tablet       Outpatient Prescriptions Prior to Visit:  diazepam (VALIUM) 5 mg Oral Tablet Take 2 Tabs (10 mg total) by mouth Every night   escitalopram oxalate (LEXAPRO) 10 mg Oral Tablet Take 1 Tab (10 mg total) by mouth Once a day   Melatonin 3 mg Oral Tablet Take 3 Tabs (9 mg total) by mouth Every night   vitamin E 400 unit Oral Capsule Take 1 Cap (400 Units total) by mouth Twice daily   busPIRone (BUSPAR) 30 mg Oral Tablet Take 1 Tab (30 mg total) by mouth Twice daily   Lamotrigine (LAMICTAL) 150 mg Oral Tablet Take 1 Tab (150 mg total) by mouth Once a day     No facility-administered medications prior to visit.    S:   Patient presents for follow up of his psychiatric problems. Reports being well on his mood and anxiety. No new symptoms or complaints. Doing well since last visit. Vincent Powell has been staying in constant contact with me over the phone for his depressive symptom. Increase in valium helped his sleep and then adding the Lexapro seems to have helped the patients mood symptoms. He is not having any major side effects from the combination or adding SSRI. ALso his wife is planning to change to part time work and eventually retire which has been a major issue for both of them for a while now. No mania/hypomania/psychosis/SI. No HI. Tolerating medications well.   Sleep/Appetite/Weight: Improved and good.  Psychiatric ROS: No nightmares/OC symptoms. Tardive symptoms in control with botox   Recent stressors: None new  Side effects to medications: None      O:  BP 110/68   Pulse 66   Resp 18   Ht 1.778 m (5\' 10" )   Wt 102.513 kg (226 lb)   BMI 32.43 kg/m2   Awake, Alert, Oriented X 3, cooperative, good eye contact, casual attire, good hygiene and grooming, dressing appropriate, Psychomotor activity normal, Gait normal. Speech normal rate, tone and volume. Mood reported as 'Good'. Affect reactive and restricted but euthymic. TP is logical, coherent and Goal Directed. TC devoid of Delusions/paranoia. No SI/HI reported currently. No AVH or other perceptual disturbances. Memory/Abstraction/Fund of Knowledge Good. Attention and concentration fair. Language/Naming/Repetition good. Executive functioning normal. Insight and Judgment Fair.     ASSESSMENT:  1. Bipolar I, MRE depressed, currently improved mood and overall affect.  2. Anxiety disorder NEC      PLAN: Continue above regimen of medications. Mood chart given to patient. Provided supportive therapy for now. Call voicemail for any worsening mood symptoms or any change in overall functioning. Crisis plan 911/ER discussed with pt. RTC x 3 months or sooner if needed. Seen and discussed with Dr Daine Gravel.      I saw and examined the patient on 09/03/2012 with Vincent Rough, MD.    I reviewed the resident's note.  I agree with the findings including assessment and plan as documented in the resident's note.  Any exceptions/additions are noted .      Darcel Bayley, MD 09/08/2012, 1:26 PM

## 2012-10-20 ENCOUNTER — Other Ambulatory Visit (HOSPITAL_BASED_OUTPATIENT_CLINIC_OR_DEPARTMENT_OTHER): Payer: Self-pay | Admitting: PSYCHIATRY

## 2012-10-20 MED ORDER — ESCITALOPRAM 10 MG TABLET
10.0000 mg | ORAL_TABLET | Freq: Every day | ORAL | Status: DC
Start: 2012-10-20 — End: 2012-11-05

## 2012-10-20 NOTE — Telephone Encounter (Signed)
This patient called to request a refill on Lexapro medications.  I agreed to one months refill so that they would have enough to last until their next appointment.  I informed the patient of this refill by telephone.

## 2012-11-05 ENCOUNTER — Ambulatory Visit (INDEPENDENT_AMBULATORY_CARE_PROVIDER_SITE_OTHER): Payer: Medicare Other

## 2012-11-05 ENCOUNTER — Encounter (HOSPITAL_BASED_OUTPATIENT_CLINIC_OR_DEPARTMENT_OTHER): Payer: Self-pay

## 2012-11-05 VITALS — BP 129/76 | HR 78 | Resp 18 | Ht 70.0 in | Wt 225.0 lb

## 2012-11-05 MED ORDER — MELATONIN 3 MG TABLET
9.0000 mg | ORAL_TABLET | Freq: Every evening | ORAL | Status: DC
Start: 2012-11-05 — End: 2022-12-07

## 2012-11-05 MED ORDER — ESCITALOPRAM 10 MG TABLET
10.0000 mg | ORAL_TABLET | Freq: Every day | ORAL | Status: DC
Start: 2012-11-05 — End: 2013-02-25

## 2012-11-05 NOTE — Patient Instructions (Signed)
 Continue above regimen of medications. Will watch for S/E and hypomanic symptoms. Will give mood chart for monitoring. Call for any worsening mood symptoms or any change in overall functioning. Crisis plan 911/ER discussed with pt. RTC x 3 months or sooner if needed. Seen and discussed with Dr. Izetta.

## 2012-11-11 NOTE — Progress Notes (Addendum)
Patient Name: Vincent Powell  (MRN [id number])  Date Of Birth: [birth year]  Date of Service: 11/05/2012      ICD-9-CM    1. Bipolar 1 disorder, depressed, mild 296.51 escitalopram oxalate (LEXAPRO) 10 mg Oral Tablet     Melatonin 3 mg Oral Tablet   2. Anxiety disorder 300.00    3. Neuroleptic-induced tardive dyskinesia 333.85        Outpatient Prescriptions Prior to Visit:  busPIRone (BUSPAR) 30 mg Oral Tablet Take 1 Tab (30 mg total) by mouth Twice daily   diazepam (VALIUM) 5 mg Oral Tablet Take 2 Tabs (10 mg total) by mouth Every night   LamoTRIgine (LAMICTAL) 150 mg Oral Tablet Take 1 Tab (150 mg total) by mouth Once a day   vitamin E 400 unit Oral Capsule Take 1 Cap (400 Units total) by mouth Twice daily   escitalopram oxalate (LEXAPRO) 10 mg Oral Tablet Take 1 Tab (10 mg total) by mouth Once a day   Melatonin 3 mg Oral Tablet Take 3 Tabs (9 mg total) by mouth Every night     No facility-administered medications prior to visit.    S:   Patient presents for follow up of his psychiatric problems. Reports being well on his mood and anxiety. No new symptoms or complaints. Mr Muzquiz comes for a follow up appointment after his last medication changes. He is doing well since adding the lexapro. His mood is staying well and he is feeling more motivated in his life. He went for a group therapy session with Hospice and felt like he saw so much suffering her feels that his pain was may be not that much. He is coming more in terms with his situation. He thinks that definitely helped him along with the medication. He is sleeping well and has more energy. He has no concentration or memory problems any more. No mania/hypomani/SI/HI. No psychosis. He is doing more physical activity. His wife also having part time job is helping as she is not as irritable as before.   Sleep/Appetite/Weight: good sleep. Appetite and weight unchanged.   Psychiatric ROS: None  Recent stressors: None new  Side effects to medications: None       O:  BP 129/76   Pulse 78   Resp 18   Ht 1.778 m (5\' 10" )   Wt 102.059 kg (225 lb)   BMI 32.28 kg/m2  Awake, Alert, Oriented X 3, cooperative, good eye contact, casual attire, good hygiene and grooming, dressing appropriate, Psychomotor activity normal, Gait normal. Speech normal rate, tone and volume. Mood reported as 'Good'. Affect reactive and restricted but euthymic. TP is logical, coherent and Goal Directed. TC devoid of Delusions/paranoia. No SI/HI reported currently. No AVH or other perceptual disturbances. Memory/Abstraction/Fund of Knowledge Good. Attention and concentration fair. Language/Naming/Repetition good. Executive functioning normal. Insight and Judgment Fair.     ASSESSMENT:  1. Bipolar-II, Depressed, improved symptoms with better functionality  2. GAD, stable  3. Normal Grief, coping well      PLAN: Continue above regimen of medications. Mood chart for monitoring and watch for S/E of medications/hypomania. Call voicemail for any worsening mood symptoms or any change in overall functioning. Crisis plan 911/ER discussed with pt. RTC x 3 months or sooner if needed. Seen and discussed with Dr Rolene Arbour.          I saw and examined the patient on 11/05/2012.  I reviewed the resident's note.  I agree with the findings including assessment and plan as documented  in the resident's note.  Any exceptions/additions are noted .        Ree Edman, MD 11/13/2012, 12:30 PM

## 2012-12-17 ENCOUNTER — Other Ambulatory Visit (HOSPITAL_BASED_OUTPATIENT_CLINIC_OR_DEPARTMENT_OTHER): Payer: Self-pay

## 2012-12-17 MED ORDER — DIAZEPAM 10 MG TABLET
10.0000 mg | ORAL_TABLET | Freq: Every evening | ORAL | Status: DC
Start: 2012-12-17 — End: 2013-02-04

## 2013-02-04 ENCOUNTER — Ambulatory Visit (INDEPENDENT_AMBULATORY_CARE_PROVIDER_SITE_OTHER): Payer: Medicare Other

## 2013-02-04 VITALS — BP 120/55 | HR 70 | Resp 18

## 2013-02-04 MED ORDER — LAMOTRIGINE 150 MG TABLET
150.0000 mg | ORAL_TABLET | Freq: Every day | ORAL | Status: DC
Start: 2013-02-04 — End: 2013-02-25

## 2013-02-04 MED ORDER — BUSPIRONE 30 MG TABLET
30.0000 mg | ORAL_TABLET | Freq: Two times a day (BID) | ORAL | Status: DC
Start: 2013-02-04 — End: 2022-12-07

## 2013-02-04 MED ORDER — DIAZEPAM 10 MG TABLET
10.0000 mg | ORAL_TABLET | Freq: Every evening | ORAL | Status: DC
Start: 2013-02-04 — End: 2022-12-07

## 2013-02-04 NOTE — Patient Instructions (Signed)
 Continue above regimen of medications. Follow for S/E. Provided supportive therapy during the interview. Call for any worsening mood symptoms or any change in overall functioning. Crisis plan 911/ER discussed with pt. RTC x 3 months or sooner if needed. Seen and discussed with Dr. Evan.

## 2013-02-12 NOTE — Progress Notes (Addendum)
Patient Name: Vincent Powell  (MRN [id number])  Date Of Birth: 1950-07-21  Date of Service: 02/04/2013  [facility]      ICD-9-CM    1. Bipolar 1 disorder, depressed, mild 296.51 LamoTRIgine (LAMICTAL) 150 mg Oral Tablet     diazepam (VALIUM) 10 mg Oral Tablet   2. Anxiety disorder 300.00 busPIRone (BUSPAR) 30 mg Oral Tablet     diazepam (VALIUM) 10 mg Oral Tablet   3. Neuroleptic-induced tardive dyskinesia 333.85        Outpatient Prescriptions Prior to Visit:  escitalopram oxalate (LEXAPRO) 10 mg Oral Tablet Take 1 Tab (10 mg total) by mouth Once a day   Melatonin 3 mg Oral Tablet Take 3 Tabs (9 mg total) by mouth Every night   vitamin E 400 unit Oral Capsule Take 1 Cap (400 Units total) by mouth Twice daily   busPIRone (BUSPAR) 30 mg Oral Tablet Take 1 Tab (30 mg total) by mouth Twice daily   diazepam (VALIUM) 10 mg Oral Tablet Take 1 Tab (10 mg total) by mouth Every night   LamoTRIgine (LAMICTAL) 150 mg Oral Tablet Take 1 Tab (150 mg total) by mouth Once a day     No facility-administered medications prior to visit.      S:   Patient presents for follow up of psychiatric problems. Reports being well on mood and anxiety. No new symptoms or complaints. Yojan has been doing well since our last visit. He has been able to better cope with his mother's death with the hospice program's counseling service. He has reported improved mood, better sleep and overall sense of relief. He feels more at peace with himself. His wife will be retiring from work in March 2015, although it will be a financial burden for them, he feels that it may make her less irritable and hostile towards him and make him his life better. They don't get along well now with constant fights and such. He does not know where their relationship is going to go towards but overall nothing new. Denies any major depressive features. Continues to have racing thoughts mostly at night about things in the past and worries about  the future. His support system is limited to his pastor now. His memory however has been going down, he forgets names and things he needs to do after going to a certain place. No problems with ADLs and IADLs otherwise.   Sleep/Appetite/Weight: Sleeping well. Appetite and weight same.   Psychiatric ROS: no nightmares. No OC symptoms. No attention deficit symptoms. No mania/Psychosis. No SI/HI. No other anxiety specific symptoms  Recent stressors: none  Side effects to medications: none      O:  BP 120/55   Pulse 70   Resp 18    MENTAL STATUS EXAMINATION:  Level of consciousness and orientation : Awake, Alert, Oriented X 3  Attitude and manner : cooperative and pleasant   Eye contact : good  Appearance : casual attire, good hygiene and grooming, dressing appropriate,  Psychomotor activity : normal   Gait : normal  Muscle tone : normal  Speech : normal rate, tone and volume.  Mood : reported Good.   Affect : reactive and restricted but euthymic.   Thought Process : logical, coherent and Goal Directed. No loose associations or flight of ideas  Thought Content : devoid of Delusions/paranoia. No SI/HI reported currently.   Perceptual disturbances : No AVH or other perceptual disturbances.   Memory/Abstraction/Fund of Knowledge : Good.  Attention and concentration : Fair.   Language/Naming/Repetition : Good.  Executive functioning : normal.   Insight and Judgment : Fair.     ASSESSMENT:  1. Bipolar disorder-II, stable on mood  2. Anxiety disorder NEC, r/o GAD      PLAN: Continue above regimen of medications. Follow for S/E. Provided supportive therapy during the interview. Needs more consistent and long term psychotherapy. Will pursue in future. Call voicemail for any worsening mood symptoms or any change in overall functioning. Crisis plan 911/ER discussed with pt. RTC x 3 months or sooner if needed. Seen and discussed with Dr Daine Gravel.      I saw and examined the patient on 02/04/2013 with Willa Rough, MD.    I  reviewed the resident's note.  I agree with the findings including assessment and plan as documented in the resident's note.  Any exceptions/additions are noted .      Darcel Bayley, MD 02/23/2013, 12:33 PM

## 2013-02-25 ENCOUNTER — Ambulatory Visit (INDEPENDENT_AMBULATORY_CARE_PROVIDER_SITE_OTHER): Payer: Medicare Other

## 2013-02-25 VITALS — BP 128/66 | HR 60 | Resp 18

## 2013-02-25 MED ORDER — LAMOTRIGINE 200 MG TABLET
200.0000 mg | ORAL_TABLET | Freq: Every evening | ORAL | Status: DC
Start: 2013-02-25 — End: 2022-12-07

## 2013-02-25 MED ORDER — ESCITALOPRAM 10 MG TABLET
10.0000 mg | ORAL_TABLET | Freq: Every day | ORAL | Status: DC
Start: 2013-02-25 — End: 2022-12-07

## 2013-02-25 NOTE — Patient Instructions (Addendum)
Continue above regimen of medications. Will change Lamictal 200 mg qhs to help with sleep and racing thoughts at night. Going to try marital counseling in [place], [place]. Call for any worsening mood symptoms or any change in overall functioning. Crisis plan 911/ER discussed with pt. RTC x 3 weeks or sooner if needed. Seen and discussed with Dr. Posey Rea

## 2013-03-11 NOTE — Progress Notes (Addendum)
Patient Name: Vincent Powell  (MRN [id number])  Date Of Birth: 08/13/1950  Date of Service: 02/25/2013  [facility]      ICD-9-CM    1. Bipolar 1 disorder, depressed, mild 296.51 escitalopram oxalate (LEXAPRO) 10 mg Oral Tablet     LamoTRIgine (LAMICTAL) 200 mg Oral Tablet       Outpatient Prescriptions Prior to Visit:  busPIRone (BUSPAR) 30 mg Oral Tablet Take 1 Tab (30 mg total) by mouth Twice daily   diazepam (VALIUM) 10 mg Oral Tablet Take 1 Tab (10 mg total) by mouth Every night   Melatonin 3 mg Oral Tablet Take 3 Tabs (9 mg total) by mouth Every night   vitamin E 400 unit Oral Capsule Take 1 Cap (400 Units total) by mouth Twice daily   escitalopram oxalate (LEXAPRO) 10 mg Oral Tablet Take 1 Tab (10 mg total) by mouth Once a day   LamoTRIgine (LAMICTAL) 150 mg Oral Tablet Take 1 Tab (150 mg total) by mouth Once a day     No facility-administered medications prior to visit.    @Chief  complaint@    S:   Patient presents for follow up of psychiatric problems. Reports being well on mood and anxiety. No new symptoms or complaints. Mr Jarold Mottopatterson comes for a follow up appointment after a brief stay in the inpatient psychiatric unit for suicidal and homicidal thoughts and also impulsive behavior. He reports that he and his wife have not got along for the last few years and have not communicated well. This had built up a lot of tension and over the last few months since his mothers death the focus has been all around this relationship. He felt a lot of impulsive thought sin his mind about killing himself and his wife to a,point where he wanted to strangle her. This has also come on because of poor sleep patterns. His brief stay in the hospital was good enough to collect his thoughts and he contemplated divorce. Now he is here with his wife to get back together. They were able to talk openly and also able to go for couples therapy in [place]    Sleep/Appetite/Weight:  Sleep still not on  par. Lots of racing thoughts at night.   Psychiatric ROS: no nightmares. No OC symptoms. No attention deficit symptoms. No mania/Psychosis. No SI/HI. No other anxiety specific symptoms  Recent stressors: none  Side effects to medications: none      O:  BP 128/66   Pulse 60   Resp 18    MENTAL STATUS EXAMINATION:  Level of consciousness and orientation : Awake, Alert, Oriented X 3  Attitude and manner : cooperative and pleasant   Eye contact : good  Appearance : casual attire, good hygiene and grooming, dressing appropriate,  Psychomotor activity : normal   Gait : normal  Muscle tone : normal  Speech : normal rate, tone and volume.  Mood : reported Good.   Affect : reactive and restricted, anxious but euthymic.   Thought Process : logical, coherent and Goal Directed. No loose associations or flight of ideas  Thought Content : devoid of Delusions/paranoia. No SI/HI reported currently.   Perceptual disturbances : No AVH or other perceptual disturbances.   Memory/Abstraction/Fund of Knowledge : Good.   Attention and concentration : Fair.   Language/Naming/Repetition : Good.  Executive functioning : normal.   Insight and Judgment : Fair.     ASSESSMENT:  1. Bipolar disorder type-I, MRE major depression, currently improved  mood with no suicidal thoughts  2. GAD, stable with some residual anxiety      PLAN: Continue above regimen of medications. Will change Lamictal 200 mg qhs to help with sleep and racing thoughts at night. Going to try marital counseling in [place], [place]. Call for any worsening mood symptoms or any change in overall functioning. Crisis plan 911/ER discussed with pt. RTC x 3 weeks or sooner if needed. Seen and discussed with Dr. Posey Rea          I saw and examined the patient on 02/25/2013.  I reviewed the resident's note.  I agree with the findings and plan of care as documented in the resident's note.  Any exceptions/additions are edited/noted.    Oren Binet, MD 03/11/2013, 9:23 AM

## 2013-03-19 ENCOUNTER — Ambulatory Visit (INDEPENDENT_AMBULATORY_CARE_PROVIDER_SITE_OTHER): Payer: Medicare Other

## 2013-03-19 VITALS — BP 112/66 | HR 53 | Resp 18

## 2013-03-19 DIAGNOSIS — G2401 Drug induced subacute dyskinesia: Secondary | ICD-10-CM

## 2013-03-19 DIAGNOSIS — F411 Generalized anxiety disorder: Secondary | ICD-10-CM

## 2013-03-19 DIAGNOSIS — T43505A Adverse effect of unspecified antipsychotics and neuroleptics, initial encounter: Secondary | ICD-10-CM

## 2013-03-19 DIAGNOSIS — F419 Anxiety disorder, unspecified: Secondary | ICD-10-CM

## 2013-03-19 DIAGNOSIS — F3131 Bipolar disorder, current episode depressed, mild: Secondary | ICD-10-CM

## 2013-03-19 NOTE — Patient Instructions (Addendum)
Continue above regimen of medications. Call for any worsening mood symptoms or any change in overall functioning. Crisis plan 911/ER discussed with pt. RTC x as previously scheduled or sooner if needed. Discussed with Dr Daine GravelBhanot.

## 2013-03-24 NOTE — Progress Notes (Addendum)
Patient Name: Vincent Powell  (MRN [id number])  Date Of Birth: 01/18/1951  Date of Service: 03/19/2013  [facility]    No diagnosis found.    Outpatient Prescriptions Prior to Visit:  busPIRone (BUSPAR) 30 mg Oral Tablet Take 1 Tab (30 mg total) by mouth Twice daily   diazepam (VALIUM) 10 mg Oral Tablet Take 1 Tab (10 mg total) by mouth Every night   escitalopram oxalate (LEXAPRO) 10 mg Oral Tablet Take 1 Tab (10 mg total) by mouth Once a day   LamoTRIgine (LAMICTAL) 200 mg Oral Tablet Take 1 Tab (200 mg total) by mouth Every night   Melatonin 3 mg Oral Tablet Take 3 Tabs (9 mg total) by mouth Every night   vitamin E 400 unit Oral Capsule Take 1 Cap (400 Units total) by mouth Twice daily     No facility-administered medications prior to visit.    @Chief  complaint@    S:   Patient presents for follow up of psychiatric problems. Reports being well on mood and anxiety. No new symptoms or complaints. [name] comes for a routine follow up after a brief inpatient admission for mood changes, irritability, SI and HI towards wife. He just could not sleep one night and all night he was ruminating multiple things and then suddenly he felt overwhelmed, emotionally uncontrollable and had urges to kill wife and kill self. He then came to ER and was admitted for 2 days on Inpatient unit. His medications were not changed really as his mood has improved immediately after being in unit and was discharged to follow up. He contemplated divorce for a moment but was talked back into the relationship by wife and daughter. He feels guilty about the incident but not anyway happy about his relationship with wife. She is more amenable to talking and communicating more. They were able to get some discussion going. No other major mood symptoms. Still worries about his symptoms and overall future.   Sleep/Appetite/Weight: Sleeping better since switching Lamictal to day time dosing. Appetite and weight same.      Psychiatric ROS: no nightmares. No OC symptoms. No attention deficit symptoms. No mania/Psychosis. No SI/HI. No other anxiety specific symptoms  Recent stressors: none new. Interpersonal discord with wife.   Side effects to medications: none      O:  BP 112/66   Pulse 53   Resp 18    MENTAL STATUS EXAMINATION:  Level of consciousness and orientation : Awake, Alert, Oriented X 3  Attitude and manner : cooperative and pleasant   Eye contact : good  Appearance : casual attire, good hygiene and grooming, dressing appropriate,  Psychomotor activity : normal   Gait : normal  Muscle tone : normal  Speech : normal rate, tone and volume.  Mood : reported Good.   Affect : reactive and restricted.   Thought Process : logical, coherent and Goal Directed. No loose associations or flight of ideas  Thought Content : devoid of Delusions/paranoia. No SI/HI reported currently.   Perceptual disturbances : No AVH or other perceptual disturbances.   Memory/Abstraction/Fund of Knowledge : Good.   Attention and concentration : Fair.   Language/Naming/Repetition : Good.  Executive functioning : normal.   Insight and Judgment : Fair.     ASSESSMENT:  1. Bipolar-I, depressed with some improvement in sleep and affective symptoms  2. GAD, stable with residual anxiety/ruminations    PLAN: Continue above regimen of medications. Call voicemail for any worsening mood symptoms or  any change in overall functioning. Crisis plan 911/ER discussed with pt. RTC x 3 months or sooner if needed. Seen and discussed with Dr Sampson Goon.      I saw and examined the patient on 03/19/2013 with Willa Rough, MD.    I reviewed the resident's note.  I agree with the findings including assessment and plan as documented in the resident's note.  Any exceptions/additions are noted .      Darcel Bayley, MD 03/25/2013, 2:15 PM

## 2013-05-07 ENCOUNTER — Encounter (HOSPITAL_BASED_OUTPATIENT_CLINIC_OR_DEPARTMENT_OTHER): Payer: Medicare Other

## 2013-08-25 ENCOUNTER — Encounter (HOSPITAL_BASED_OUTPATIENT_CLINIC_OR_DEPARTMENT_OTHER): Payer: Self-pay

## 2013-08-25 NOTE — Progress Notes (Addendum)
[facility]  [address]  [place], [place] [id number]  [phone]    BEHAVIORAL MEDICINE NOTE    PATIENT NAME: Vincent Powell, Vincent Powell  CHART NUMBER: [id number]  DATE OF BIRTH: 1950/06/19  DATE OF SERVICE: 08/23/2013    TRANSFER SUMMARY    DIAGNOSES:  1.  Bipolar disorder type 1, predominantly depressed with recent episode of major depression, hospitalization resulting in change of medications.  2.  Generalized anxiety disorder, stable with residual anxiety and ruminations.    CURRENT MEDICATIONS:  1.  Lamictal 200 mg 1 tablet at bedtime.  2.  Valium 10 mg at bedtime.  3.  BuSpar 30 mg twice a day.  4.  Lexapro 10 mg daily, which was stopped in the hospital stay.  5.  Melatonin 6 mg at bedtime.  6.  Vitam E 4000 units daily.  7.  Invega 6 mg extended release started by admitting physician recently.    BRIEF OVERVIEW OF HISTORY AND PSYCHOSOCIAL FACTORS:  [name] is a 63 year old Caucasian male who is [place], comes here for his appointments.  The patient was seen by Dr. Laurena SpiesMasilamani in 2009.  At that time, he was on Seroquel, [place]vega, Xanax and [place]enazine.  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  He was tried on [place]enazine, was taken off all antipsychotics for that reason.  His tardive dyskinesia only got worse and caused ulcerations on his tongue and gums by unwanted chewing movements.  He was gradually switched to Seroquel and tapered off eventually and kept him on Valium after to help with relaxation.  He was also tried on Keppra at one point with minimal response.  He was eventually referred to another neurologist for Botox injections of his muscles, which was helpful, and [facility].  He has currently been off all antipsychotics and he has been doing fairly well.  During the course of treatment, he developed depression even  after the dyskinesia was controlled, and we started him on Lamictal, which was increased to 200 mg.  He tried Celexa with partial resolution of symptoms, but he rapidly developed manic symptoms with fast speech and decreased need for sleep, so we took him off of it.  The last few times, because of increasing depression, the patient was started on Lexapro, which he took for 6 months, which noticed a significant difference, and then his mood started getting all over the place, and we had to stop it.  Even after stopping it, his mood was still worse and eventually had to go in the hospital and get admitted for possible homicidal ideation.  He was actually on 6-[place] for a couple of days, and he got better and went off and was planning to divorce his wife.  A lot of interpersonal problems between him and his wife.  Initially, it was just a "general midlife crisis" per his report, but eventually it developed to a point where he felt like getting up in the middle of the night strangling her.  He could not control his impulse and hence called 911 and came to the emergency room.  [facility].  He was started on Invega at that point of time.  He seems to have tolerated it well and eventually got better.  Since his hospitalization, I could not see him for various reasons.  I did talk to  him once on the phone, and he seems like he is doing well on that.  Notably, also he lost his mother who died of terminal cancer after battling with it for 6 months, and he has had his hands full with that for a while after which he went through hospice for counseling, which really helped him deal with the grief.  He does use a CPAP machine sometimes for his sleep but does not use it consistently.    MEDICATION TRIALS:  I was not able to look through his medication trials and details, but it seems like he has always been on the regimen of Lamictal and Valium with BuSpar.  He seems to have responded to some extent.  The recent  decompensation seems to be related to his stress with his relationship with his wife.  I would first see him before doing any recommendations or sending any medications online, especially his new medications.  I did talk with Dr. Daine GravelBhanot about his history of tardive dyskinesia and him being on Invega.  We think we were going to talk to him and try to get him off the [facility] or just keep him on the minimal possible dose should be pursued.      RECOMMENDATIONS:  I would keep him on the same regimen as long as possible, try to avoid any antipsychotics, even Invega as it has propensity for tardive dyskinesia.  If anything, I would consider using Abilify as it has the least propensity, but even that is going to be risky with him.  Adding a second mood stabilizer would be a good option.  At this point of time, lithium would be a good option with his impulsive nature, I would think that would help with that.  I did try to talk with him about that, but he seemed very hesitant with that idea.  I would try to see him every month until he stabilizes otherwise and not make any changes over the phone at this point in time.  He is usually very compliant and a very likeable person and is a good patient to have otherwise.  I would check his medications on a regular basis.  Dr. Daine GravelBhanot knows the patient very well.  I would have her supervisor the case if possible.      Willa Roughheeraj Kodali, MD  Resident, [facility]  [address] [place], [place]    Darcel BayleyVeena Sherika Kubicki, MD  Professor, [facility]  [place], [place]    [id number]; D: 08/23/2013 21:52:43; T: 08/25/2013 00:47:13

## 2020-02-11 IMAGING — MR MRI LUMBAR SPINE WITHOUT CONTRAST
6 series · 48 of 48 positions shown · non-contrast
Comparison: None previous.

﻿EXAM:  MRI LUMBAR SPINE WITHOUT CONTRAST
INDICATION: 69-year-old with persistent low back pain, bilateral lower extremity weakness causing him to fall with head injury.
TECHNIQUE: Axial, coronal and sagittal images including T1, T2 and STIR sequences.

[Series 7: T2 · coronal · 4.0mm · 1.79mm/px · 8 of 20 slices shown (1 of 3)]
[im 1/20]
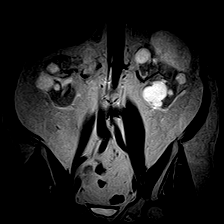
[im 3/20]
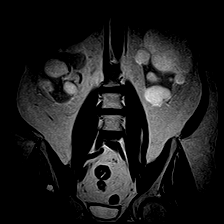
[im 6/20]
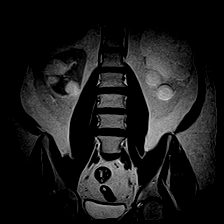
[im 9/20]
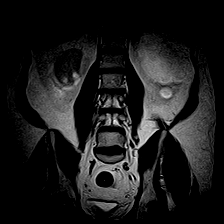
[im 11/20]
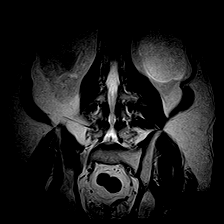
[im 14/20]
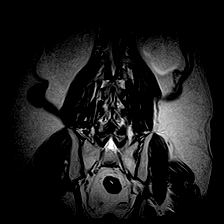
[im 17/20]
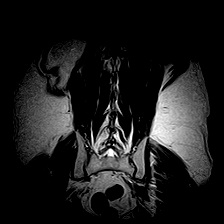
[im 20/20]
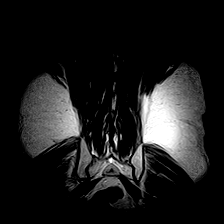

[Series 8: T2 · sagittal · 5.0mm · 1.06mm/px · 6 of 13 slices shown (2 of 3)]
[im 1/13]
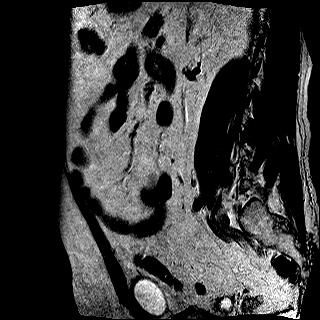
[im 3/13]
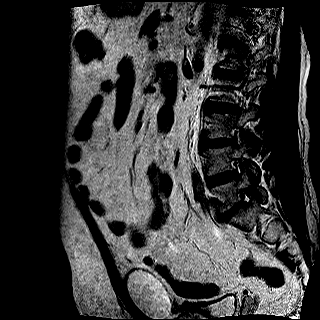
[im 5/13]
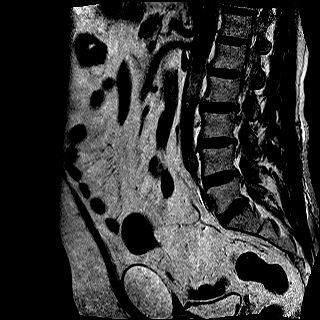
[im 8/13]
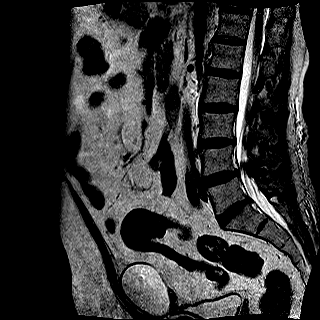
[im 10/13]
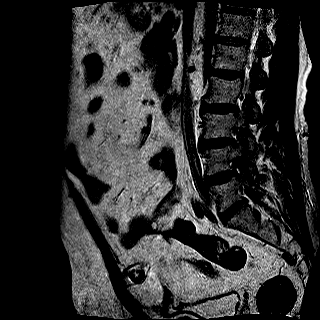
[im 13/13]
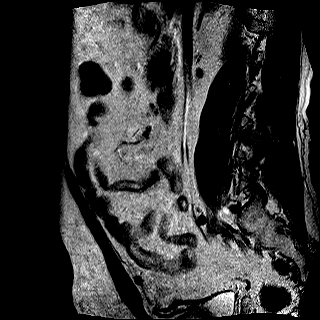

[Series 9: T1 · sagittal · 5.0mm · 1.06mm/px · 6 of 13 slices shown (1 of 2)]
[im 1/13]
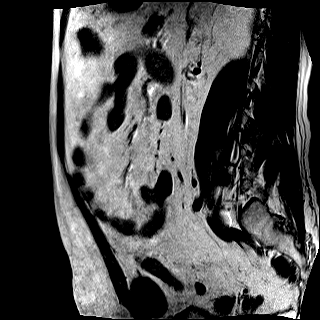
[im 3/13]
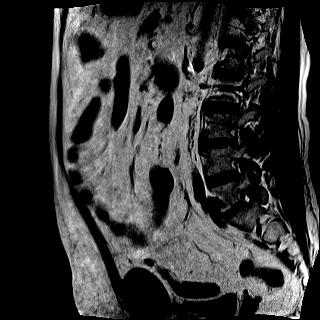
[im 5/13]
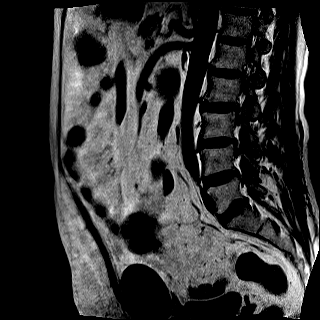
[im 8/13]
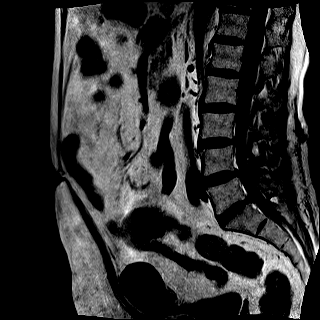
[im 10/13]
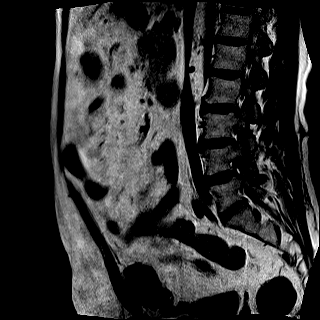
[im 13/13]
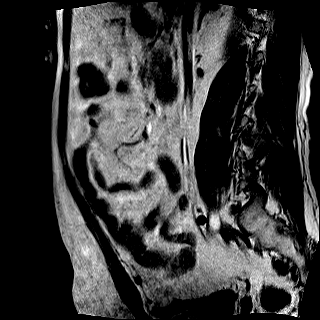

[Series 11: STIR · sagittal · 5.0mm · 1.33mm/px · 6 of 13 slices shown]
[im 1/13]
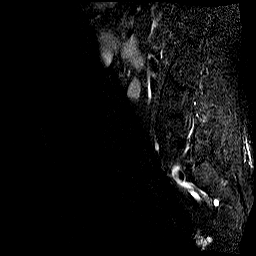
[im 3/13]
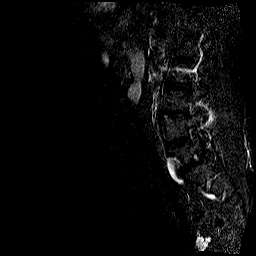
[im 5/13]
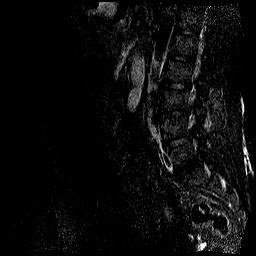
[im 8/13]
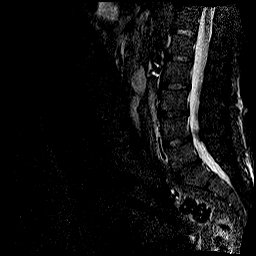
[im 10/13]
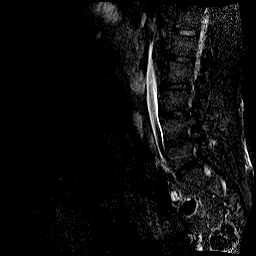
[im 13/13]
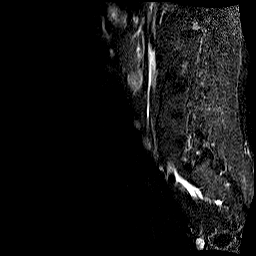

[Series 12: T1 · axial · 5.0mm · 0.89mm/px · z∈[-92,+121]mm · 11 of 25 slices shown (2 of 2)]
[im 1/25]
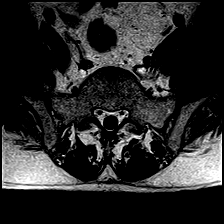
[im 3/25]
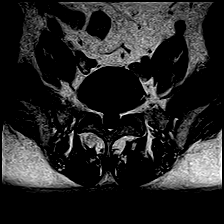
[im 5/25]
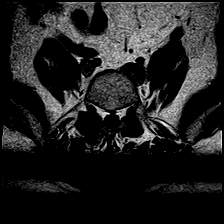
[im 8/25]
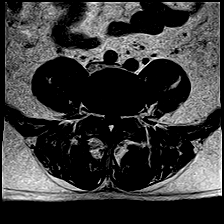
[im 10/25]
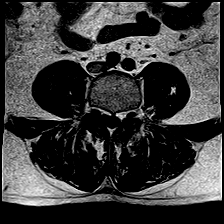
[im 13/25]
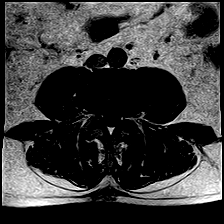
[im 15/25]
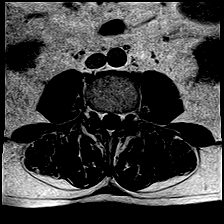
[im 17/25]
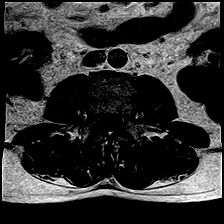
[im 20/25]
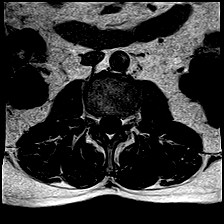
[im 22/25]
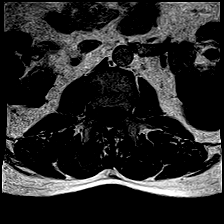
[im 25/25]
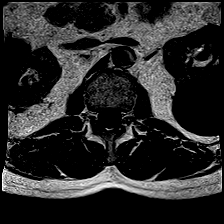

[Series 13: T2 · axial · 5.0mm · 0.89mm/px · z∈[-92,+121]mm · 11 of 25 slices shown (3 of 3)]
[im 1/25]
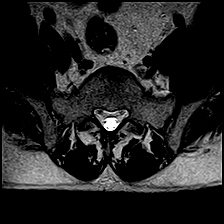
[im 3/25]
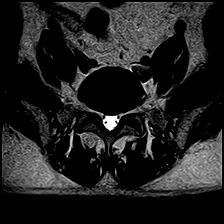
[im 5/25]
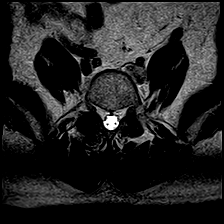
[im 8/25]
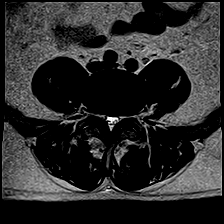
[im 10/25]
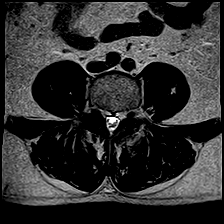
[im 13/25]
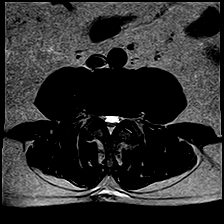
[im 15/25]
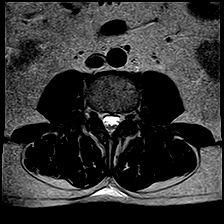
[im 17/25]
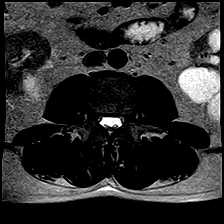
[im 20/25]
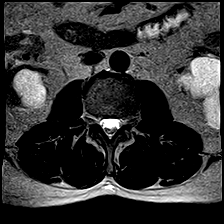
[im 22/25]
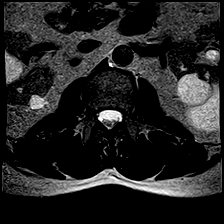
[im 25/25]
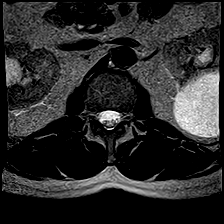

[48 of 48 positions shown; findings below may reference images not displayed]

FINDINGS: No acute bony lesions of lumbar vertebrae are seen.  Lower spinal cord and cauda equina are normal.  

At L1-L2 level, degenerative disc disease and facet arthropathy are causing mild compromise of both lateral recesses. 

At L2-L3 level, degenerative disc disease and facet arthropathy are noted causing significant biforaminal narrowing.  

At L3-L4 level, degenerative disc disease and facet arthropathy are noted causing significant biforaminal narrowing.  AP diameter of thecal sac in the midline measures 8.5 mm.  

At L4-L5 level, significant bilateral facet arthropathy and degenerative disc changes are noted causing significant compromise of both lateral recesses and moderate spinal stenosis with AP diameter of the thecal sac in the midline measuring 8.9 mm.  

At L5-S1 level, degenerative disc changes and facet arthropathy are causing moderate to significant biforaminal stenosis, left more than the right.  

Paravertebral soft tissues show multiple cysts of both kidneys, 6-7 in number up to 10 cm in diameter.  Midline suprapubic cystic lesion 6 x 4 cm in size above the anterior aspect of urinary bladder is noted suggestive of urachal cyst.  Cystic change of superior lip of the acetabular labrum at the right hip due to paralabral cyst from chronic tear at the right hip is noted.
IMPRESSION: 1. No acute bone changes of lumbar vertebrae. 

2. Multilevel degenerative disc changes and facet arthropathy are noted as described above in detail.  Most prominent findings are noted at L4-L5 level and to a lesser degree at L3-L4 and L5-S1 levels.  

3. Numerous cystic lesions of both kidneys.  Possible polycystic disease, if there is family history.  

4. A 6 x 4 cm cyst in the suprapubic region in the pelvis suggestive of possible urachal cyst.  Chronic cystic changes of acetabular labrum at the right hip due to paralabral cyst from chronic tear and degenerative change.

## 2020-02-11 IMAGING — MR MRI BRAIN WITHOUT AND WITH CONTRAST
10 of 13 series · 35 of 48 positions shown · IV contrast (gadavist)
Comparison: MRI lumbar spine of the same date.

﻿EXAM:  MRI BRAIN WITHOUT AND WITH CONTRAST
INDICATION: Trauma due to weakness of lower extremities.  No prior history of malignancy.
TECHNIQUE: Axial, coronal and sagittal images including diffusion-weighted sequence, T2* gradient echo sequence, FLAIR sequence, postcontrast series after administration of 5 mL Gadavist IV.

[Series 5: DWI · axial · 5.0mm · 1.35mm/px · z∈[-23,+103]mm · 9 of 88 slices shown (1 of 3)]
[im 1/88]
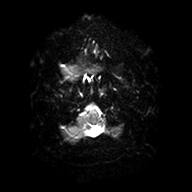
[im 16/88]
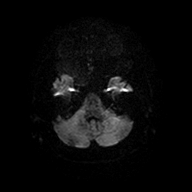
[im 24/88]
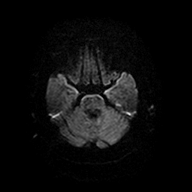
[im 40/88]
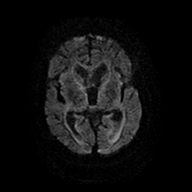
[im 48/88]
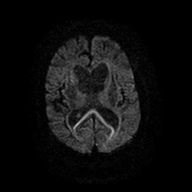
[im 64/88]
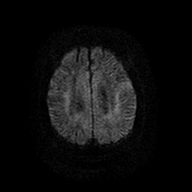
[im 72/88]
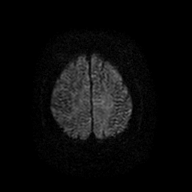
[im 80/88]
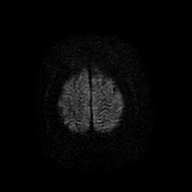
[im 88/88]
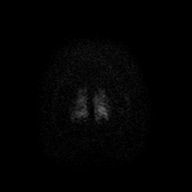

[Series 6: DWI · axial · 5.0mm · 1.35mm/px · z∈[-23,+103]mm · 3 of 22 slices shown (2 of 3)]
[im 1/22]
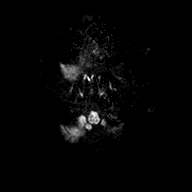
[im 11/22]
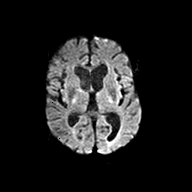
[im 22/22]
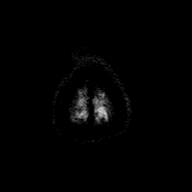

[Series 7: DWI · axial · 5.0mm · 1.35mm/px · z∈[-23,+103]mm · 3 of 22 slices shown (3 of 3)]
[im 1/22]
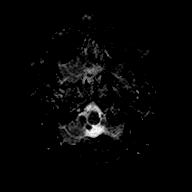
[im 11/22]
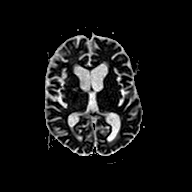
[im 22/22]
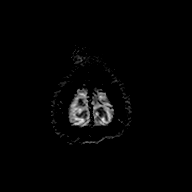

[Series 8: FLAIR · sagittal · 4.0mm · 0.75mm/px · 3 of 26 slices shown (1 of 2)]
[im 1/26]
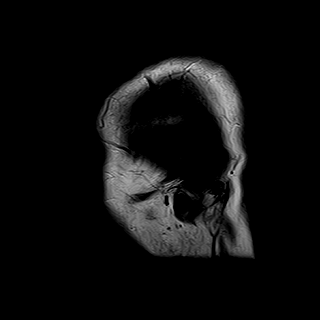
[im 13/26]
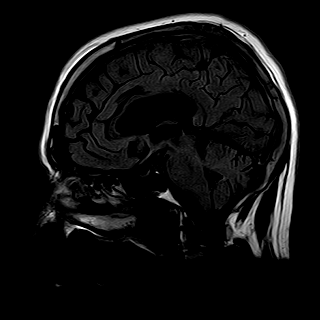
[im 26/26]
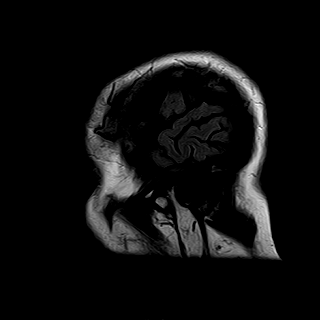

[Series 9: T2 · axial · 5.0mm · 0.43mm/px · z∈[-36,+120]mm · 3 of 27 slices shown (1 of 2)]
[im 1/27]
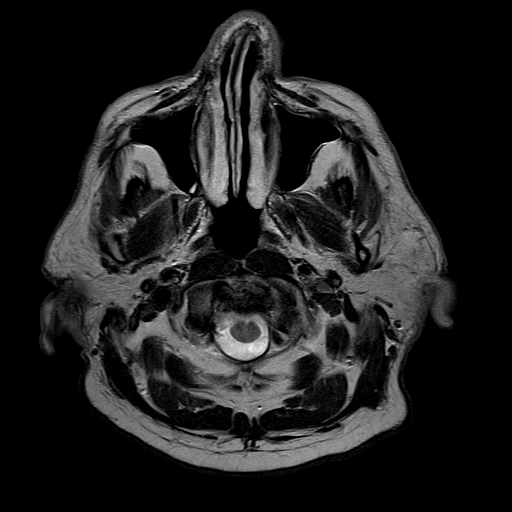
[im 14/27]
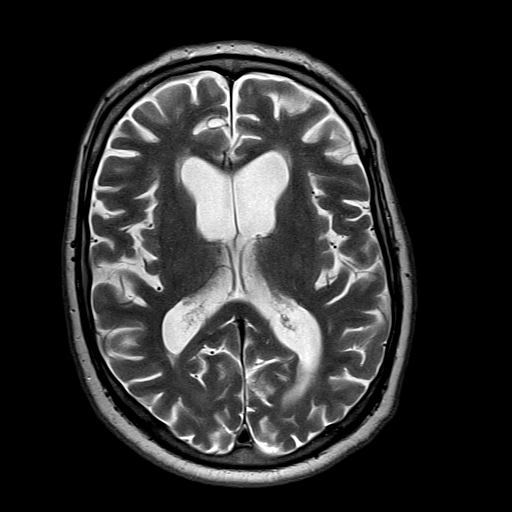
[im 27/27]
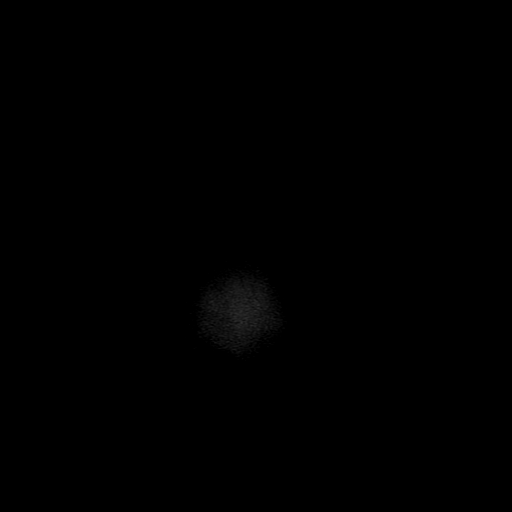

[Series 10: FLAIR · axial · 5.0mm · 0.43mm/px · z∈[-36,+120]mm · 3 of 27 slices shown (2 of 2)]
[im 1/27]
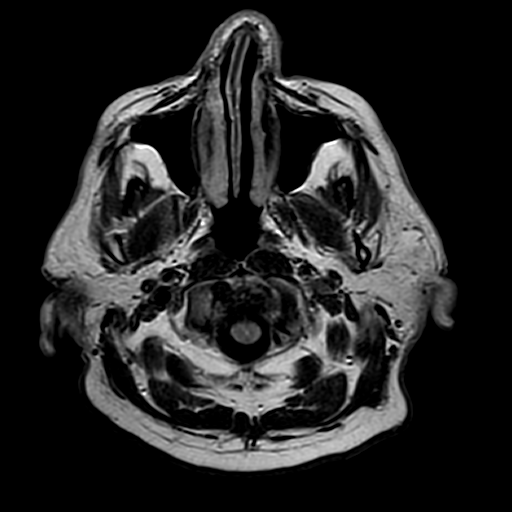
[im 14/27]
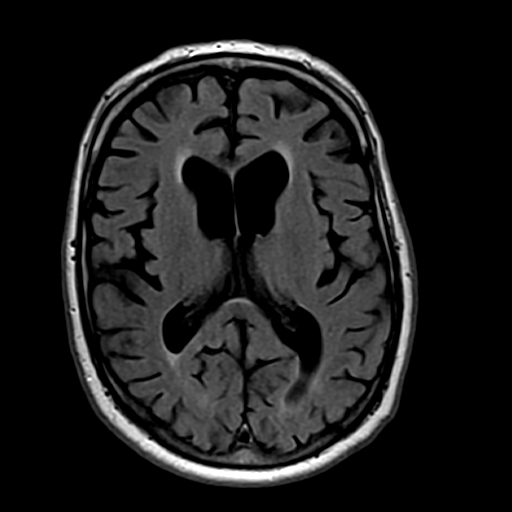
[im 27/27]
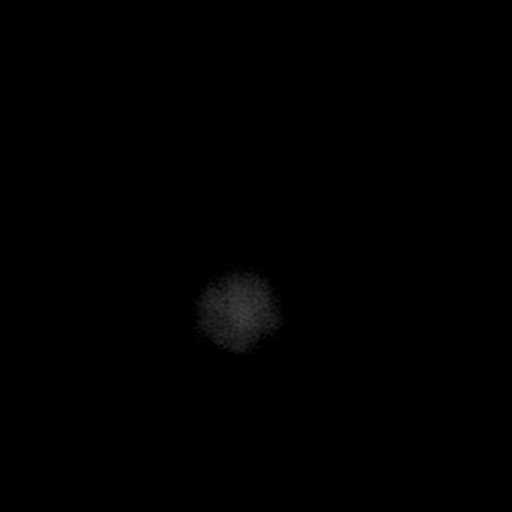

[Series 11: T1 · axial · 5.0mm · 0.43mm/px · z∈[-36,+120]mm · 3 of 27 slices shown]
[im 1/27]
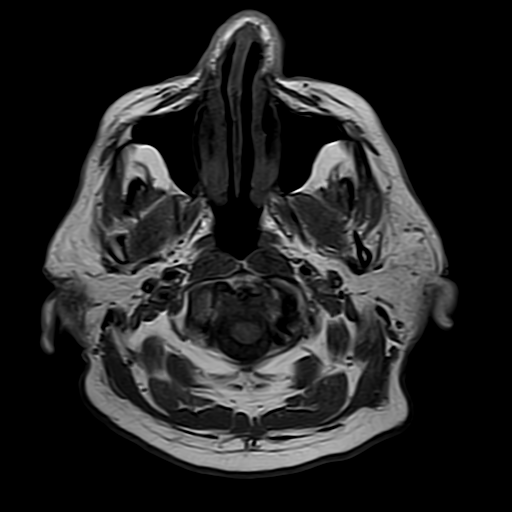
[im 14/27]
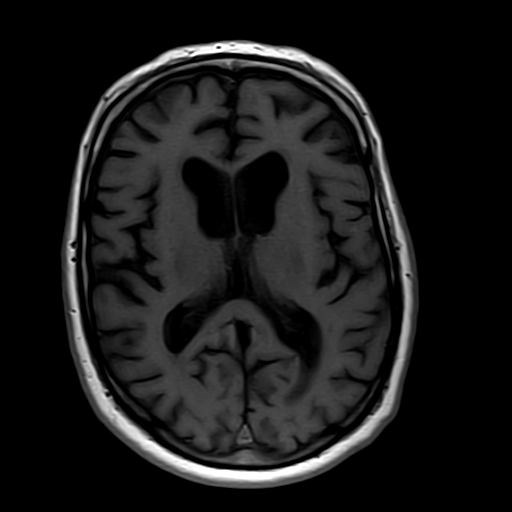
[im 27/27]
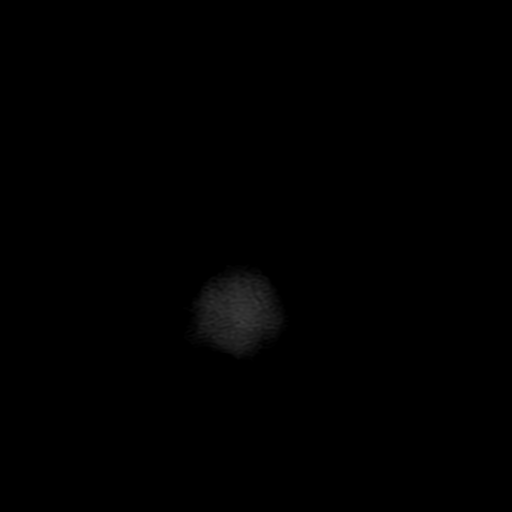

[Series 13: T2 · coronal · 6.0mm · 0.43mm/px · 3 of 24 slices shown (2 of 2)]
[im 1/24]
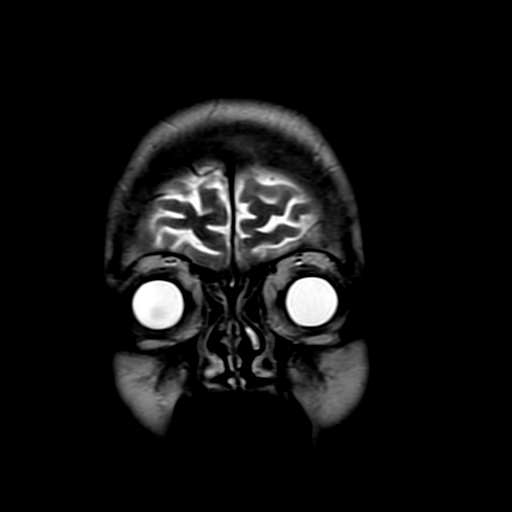
[im 12/24]
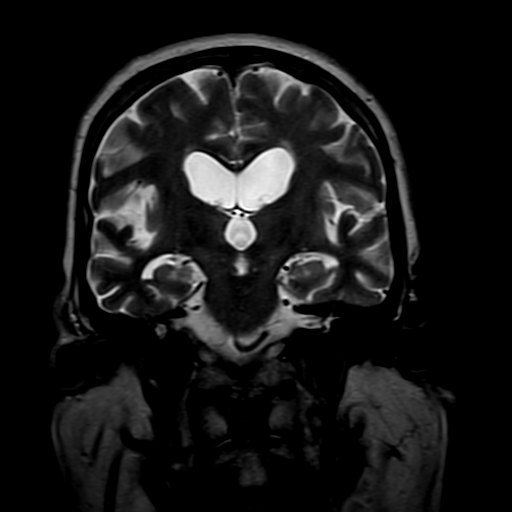
[im 24/24]
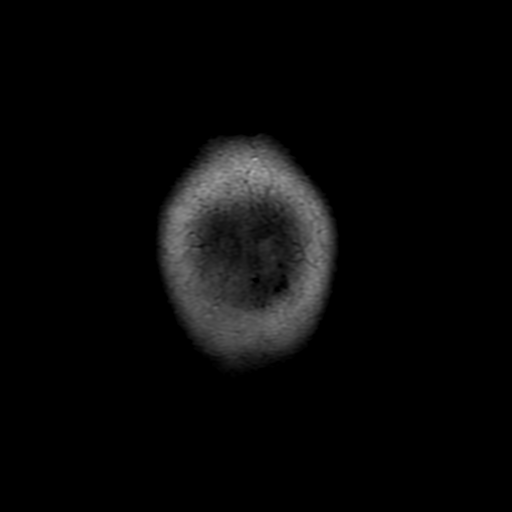

[Series 14: T1 fat-sat · coronal · 6.0mm · 0.57mm/px · 3 of 24 slices shown (1 of 2)]
[im 1/24]
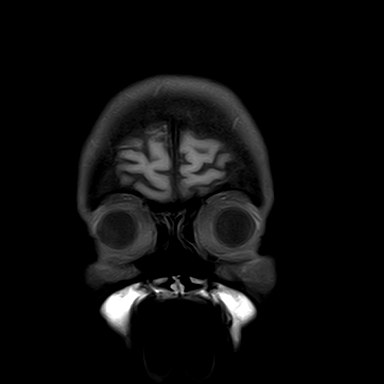
[im 12/24]
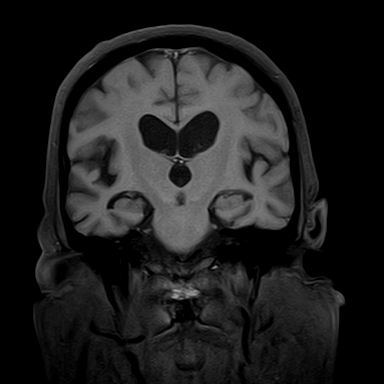
[im 24/24]
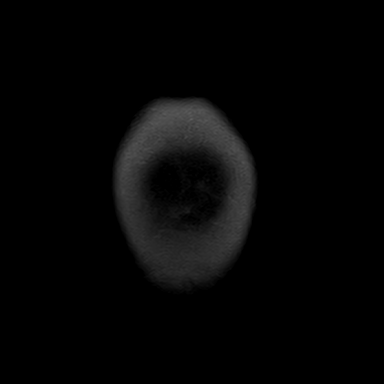

[Series 15: T1 fat-sat · axial · 5.0mm · 0.43mm/px · z∈[-36,+42]mm · 2 of 27 slices shown (2 of 2)]
[im 1/27]
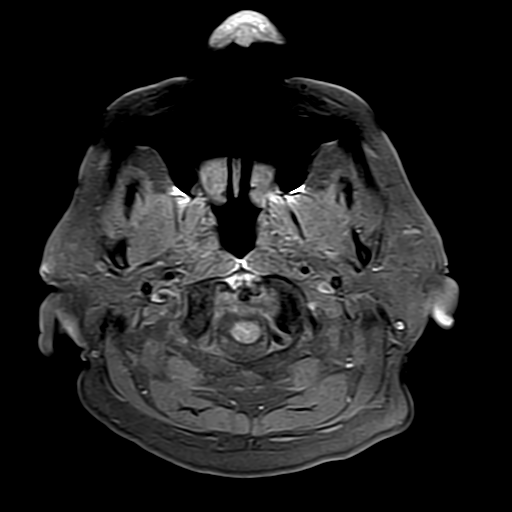
[im 14/27]
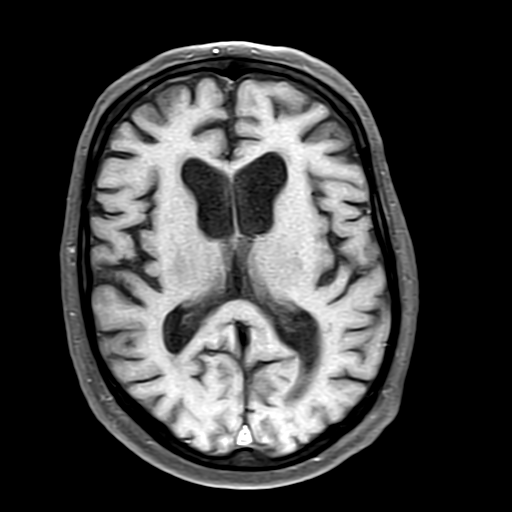

[35 of 48 positions shown; findings below may reference images not displayed]

FINDINGS: No focal areas of restricted diffusion on diffusion-weighted sequence.  No intracranial bleed or extra-axial collections are seen.  Symmetric moderate global bilateral cerebral cortical atrophy is noted.  Mild ventriculomegaly due to volume loss.  Mild cerebellar atrophy. 

Major arteries of circle of Willis and dural venous sinuses are patent.  No abnormal enhancement on the postcontrast examination.
IMPRESSION: 1. No evidence of acute ischemia, intracranial bleed or space occupying lesions.

2. Moderate, symmetric cerebral cortical atrophy with mild prominence of lateral ventricles due to volume loss.  

3. No abnormal enhancement on postcontrast study.  Major arteries of circle of Willis and dural venous sinuses are patent.

## 2021-05-05 ENCOUNTER — Encounter (HOSPITAL_COMMUNITY): Payer: Self-pay

## 2021-11-05 LAB — LAB

## 2022-12-07 ENCOUNTER — Encounter (INDEPENDENT_AMBULATORY_CARE_PROVIDER_SITE_OTHER): Payer: Self-pay | Admitting: NURSE PRACTITIONER

## 2022-12-07 ENCOUNTER — Ambulatory Visit (INDEPENDENT_AMBULATORY_CARE_PROVIDER_SITE_OTHER): Payer: Self-pay | Admitting: NURSE PRACTITIONER

## 2022-12-07 VITALS — BP 119/63 | HR 60 | Temp 97.0°F | Resp 12 | Ht 70.0 in | Wt 195.0 lb

## 2022-12-07 DIAGNOSIS — Z7689 Persons encountering health services in other specified circumstances: Secondary | ICD-10-CM

## 2022-12-07 DIAGNOSIS — I10 Essential (primary) hypertension: Secondary | ICD-10-CM

## 2022-12-07 DIAGNOSIS — E119 Type 2 diabetes mellitus without complications: Secondary | ICD-10-CM

## 2022-12-07 DIAGNOSIS — R4702 Dysphasia: Secondary | ICD-10-CM

## 2022-12-07 DIAGNOSIS — G912 (Idiopathic) normal pressure hydrocephalus: Secondary | ICD-10-CM

## 2022-12-07 DIAGNOSIS — K219 Gastro-esophageal reflux disease without esophagitis: Secondary | ICD-10-CM

## 2022-12-07 DIAGNOSIS — F3131 Bipolar disorder, current episode depressed, mild: Secondary | ICD-10-CM

## 2022-12-07 DIAGNOSIS — H9193 Unspecified hearing loss, bilateral: Secondary | ICD-10-CM

## 2022-12-07 DIAGNOSIS — E785 Hyperlipidemia, unspecified: Secondary | ICD-10-CM

## 2022-12-07 MED ORDER — TRIAMCINOLONE ACETONIDE 55 MCG NASAL SPRAY AEROSOL
2.0000 | INHALATION_SPRAY | Freq: Every day | NASAL | 3 refills | Status: AC
Start: 2022-12-07 — End: 2023-06-05

## 2022-12-07 NOTE — Progress Notes (Signed)
[facility]  [address]  [place] [address]    Progress Note    Encounter Date: 12/07/2022    Patient ID:  Vincent Powell  WUJ:[id number]    DOB: 09/10/1950  Age: 72 y.o. male  Subjective   Subjective:     Chief Complaint   Patient presents with    New Patient    Physical    Establish Care    Blood Work     HPI    Patient is a 72 y/o male w/ h/o anxiety, DM Type 2 and HTN. The patient presents as a new patient with a history of normal pressure hydrocephalus, for which he had surgery in July and is currently recovering. He also has a history of type 2 diabetes, hypertension, hyperlipidemia, bipolar disorder, anxiety, tardive dyskinesia, GERD, and a past diagnosis of dementia, which might have been related to the hydrocephalus.    The patient reports feeling "rotten" today, possibly due to the changing weather. He had a big physical therapy appointment yesterday and went out twice, which was the most he has moved in two years. The patient has been spitting up yellow phlegm for the past couple of days and has a history of sneezing three times in a row, but no fever or other symptoms.    The patient has a history of pneumonia and dysphagia, which was not initially mentioned. He has decreased hearing in both ears, more on the left side, and impacted earwax.      Current Outpatient Medications   Medication Sig    Triamcinolone Acetonide (NASACORT) 55 mcg Nasal Aerosol, Spray Administer 2 Sprays into affected nostril(s) Once a day for 180 days     Allergies   Allergen Reactions    Paliperidone     Ziprasidone  Other Adverse Reaction (Add comment)     Tardive dyskinesia     Past Medical History:   Diagnosis Date    Anxiety     Diabetes mellitus, type 2 (CMS HCC)     HTN     Hypercholesterolemia          Past Surgical History:   Procedure Laterality Date    HX APPENDECTOMY      HX TONSILLECTOMY           Family Medical History:    None         Social History     Tobacco  Use    Smoking status: Never    Smokeless tobacco: Never   Substance Use Topics    Alcohol use: No    Drug use: No       Review of Systems   Constitutional:  Negative for activity change, appetite change, chills, diaphoresis, fatigue, fever and unexpected weight change.   HENT:  Positive for hearing loss. Negative for congestion, facial swelling, mouth sores, nosebleeds, postnasal drip, rhinorrhea, sinus pressure, sinus pain, sneezing, sore throat, trouble swallowing and voice change.    Respiratory:  Negative for apnea, cough, choking, chest tightness, shortness of breath, wheezing and stridor.    Cardiovascular:  Negative for chest pain, palpitations and leg swelling.   Gastrointestinal:  Negative for abdominal distention, abdominal pain, anal bleeding, blood in stool, constipation, diarrhea, nausea, rectal pain and vomiting.   Endocrine: Negative for cold intolerance, heat intolerance, polydipsia, polyphagia and polyuria.   Musculoskeletal:  Negative for arthralgias, back pain, gait problem, joint swelling, myalgias, neck pain and neck stiffness.   Skin:  Negative  for color change, pallor, rash and wound.   Allergic/Immunologic: Negative for environmental allergies, food allergies and immunocompromised state.   Neurological:  Negative for dizziness, tremors, seizures, syncope, facial asymmetry, speech difficulty, weakness, light-headedness, numbness and headaches.   Hematological:  Negative for adenopathy. Does not bruise/bleed easily.   Psychiatric/Behavioral:  Negative for agitation, behavioral problems, confusion, decreased concentration, dysphoric mood, hallucinations, self-injury, sleep disturbance and suicidal ideas. The patient is not nervous/anxious and is not hyperactive.         Objective   Objective:   Vitals: BP 119/63   Pulse 60   Temp 36.1 C (97 F)   Resp 12   Ht 1.778 m (5\' 10" )   Wt 88.5 kg (195 lb)   SpO2 99%   BMI 27.98 kg/m         Physical Exam  Constitutional:       Appearance:  Normal appearance. He is normal weight.   HENT:      Head: Normocephalic and atraumatic.      Nose: Nose normal.      Mouth/Throat:      Pharynx: Oropharynx is clear.   Cardiovascular:      Rate and Rhythm: Normal rate and regular rhythm.      Pulses: Normal pulses.      Heart sounds: Normal heart sounds. No murmur heard.     No gallop.   Pulmonary:      Effort: Pulmonary effort is normal. No respiratory distress.      Breath sounds: Normal breath sounds.   Musculoskeletal:         General: Normal range of motion.      Cervical back: Normal range of motion.   Neurological:      General: No focal deficit present.      Mental Status: He is alert and oriented to person, place, and time. Mental status is at baseline.   Psychiatric:         Mood and Affect: Mood normal.         Behavior: Behavior normal.         Thought Content: Thought content normal.         Judgment: Judgment normal.          Assessment & Plan:     ENCOUNTER DIAGNOSES     ICD-10-CM   1. Encounter to establish care  Z76.89   2. Type 2 diabetes mellitus (CMS HCC)  E11.9   3. HTN (hypertension)  I10   4. Hyperlipidemia  E78.5   5. Bipolar 1 disorder, depressed, mild (CMS HCC)  F31.31   6. GERD (gastroesophageal reflux disease)  K21.9   7. Dysphasia  R47.02   8. Decreased hearing, bilateral  H91.93     Type 2 Diabetes  - Plan:    - Continue current medication regimen.    - Monitor blood glucose levels.    - Follow up in 3 months.    Hypertension  - Plan:    - Continue current medication regimen.    - Follow up in 3 months.    Hyperlipidemia  - Plan:    - Continue Pravastatin.    - Monitor lipid panel.    - Follow up in 3 months.    Bipolar Disorder  - Plan:    - Continue Zyprexa and Depakote.    - Monitor mood.    - Follow up in 3 months.    Anxiety  - Plan:    - Continue Klonopin  as needed.    - Monitor symptoms.    - Follow up in 3 months.    Impacted Earwax and Decreased Hearing  - Plan:    - Refer to ENT at [facility] for evaluation and  management.    - Follow up after ENT appointment.    40 minutes total provider time spent on preparing for appointment, reviewing records, counselling patient,examining patient and charting.    Portions of this note may be dictated using voice recognition software or a dictation service. Variances in spelling and vocabulary are possible and unintentional. Not all errors are caught/corrected. Please notify the [facility] if any discrepancies are noted, or if the meaning of any statement is not clear.    Orders Placed This Encounter    Refer to [facility] (ENT)    Triamcinolone Acetonide (NASACORT) 55 mcg Nasal Aerosol, Spray       Return in about 4 weeks (around 01/04/2023).    I am scribing for, and in the presence of, Lesly Rubenstein DNP, FNP-BC for services provided on 12/07/2022.  Jerold [place], [place]

## 2022-12-10 ENCOUNTER — Encounter (INDEPENDENT_AMBULATORY_CARE_PROVIDER_SITE_OTHER): Payer: Self-pay | Admitting: NURSE PRACTITIONER

## 2022-12-11 ENCOUNTER — Encounter (INDEPENDENT_AMBULATORY_CARE_PROVIDER_SITE_OTHER): Payer: Self-pay | Admitting: NURSE PRACTITIONER

## 2022-12-11 NOTE — Telephone Encounter (Signed)
Hi Ms. Daughter is asking if you want to order new labs for the patient

## 2022-12-11 NOTE — Addendum Note (Signed)
Addended by: Lesly Rubenstein D on: 12/11/2022 01:07 PM     Modules accepted: Orders

## 2022-12-12 ENCOUNTER — Ambulatory Visit: Payer: Medicare Other | Attending: Physician Assistant | Admitting: Physician Assistant

## 2022-12-12 ENCOUNTER — Other Ambulatory Visit: Payer: Self-pay

## 2022-12-12 ENCOUNTER — Encounter (INDEPENDENT_AMBULATORY_CARE_PROVIDER_SITE_OTHER): Payer: Self-pay | Admitting: Physician Assistant

## 2022-12-12 VITALS — BP 124/79 | HR 59 | Temp 97.6°F | Ht 70.0 in | Wt 195.0 lb

## 2022-12-12 DIAGNOSIS — H9193 Unspecified hearing loss, bilateral: Secondary | ICD-10-CM

## 2022-12-12 DIAGNOSIS — H9190 Unspecified hearing loss, unspecified ear: Secondary | ICD-10-CM

## 2022-12-12 DIAGNOSIS — H6123 Impacted cerumen, bilateral: Secondary | ICD-10-CM

## 2022-12-12 NOTE — H&P (Signed)
[facility], [facility]  [address]  [place] [address]  Operated by [facility]    PATIENT NAME:  Vincent Powell  MRN:  [id number]  DOB:  [birth year]  DATE OF SERVICE: 12/12/2022    Chief Complaint:  Fontaine No in Ear      HPI:  Dallas Torok is a 72 y.o. male who was referred for evaluation of bilateral impacted cerumen and decreased hearing, bilateral. Patient is here with his daughter helps to provide patient history. 12/07/2022 visit at [facility] stated impacted ear wax and decreased hearing with referral to ENT without seen examination or treatment. Patient and daughter deny any use of cerumen softening ear drops or at home ear flushing. Patient states that he has used Q-tips in the past but not recently. Daughter reports patient history of wax removal lastly 5 years ago with ENT provider in [place]. No patient history of ear surgery. Patient denies any recent/current ear symptoms of bilateral ear pain or observed ear drainage. No recalled history of any treated ear infections. Patient reports a 6 month history of bilateral left>right decrease in hearing. Patient recalled last hearing screening was an employment screening 20+ years which was normal as recalled. 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 No hearing aid device use. No other ear complaints.    Past Medical History:  Past Medical History:   Diagnosis Date    Anxiety     Diabetes mellitus, type 2 (CMS HCC)     HTN     Hypercholesterolemia     NPH (normal pressure hydrocephalus) (CMS HCC)      Past Surgical History:  Past Surgical History:   Procedure Laterality Date    HX APPENDECTOMY      HX TONSILLECTOMY       Family History:  Family Medical History:    None       Social History:  Social History     Tobacco Use   Smoking Status Never   Smokeless Tobacco Never     Social  History     Substance and Sexual Activity   Alcohol Use No     Social History     Occupational History    Not on file       Medications:  Outpatient Medications Marked as Taking for the 12/12/22 encounter (Office Visit) with Spero Curb, PA-C   Medication Sig    carbidopa-levodopa (SINEMET CR) 25-100 mg Oral Tablet Sustained Release Take 2 Tablets by mouth    clonazePAM (KLONOPIN) 1 mg Oral Tablet Take 1 Tablet (1 mg total) by mouth    cloNIDine HCL (CATAPRES) 0.1 mg Oral Tablet Take 1 Tablet (0.1 mg total) by mouth    divalproex (DEPAKOTE ER) 500 mg Oral Tablet Sustained Release 24 hr 2 Tablets (1,000 mg total)    glipiZIDE (GLUCOTROL XL) 10 mg Oral Tablet Extended Rel 24 hr Take 1 Tablet (10 mg total) by mouth Once a day    Levothyroxine 50 mcg Oral Capsule Take 1 Capsule (50 mcg total) by mouth    lisinopriL (PRINIVIL) 5 mg Oral Tablet Take 1 Tablet (5 mg total) by mouth    memantine (NAMENDA XR) 28 mg Oral capsule,sprinkle,ER 24hr Take 1 Capsule (28 mg total) by mouth    OLANZapine (ZYPREXA) 2.5 mg Oral Tablet Take 1 Tablet (2.5 mg total) by mouth  omeprazole (PRILOSEC) 20 mg Oral Capsule, Delayed Release(E.C.) Take 1 Capsule (20 mg total) by mouth    pravastatin (PRAVACHOL) 40 mg Oral Tablet 1 Tablet (40 mg total)    Triamcinolone Acetonide (NASACORT) 55 mcg Nasal Aerosol, Spray Administer 2 Sprays into affected nostril(s) Once a day for 180 days       Allergies:  Allergies   Allergen Reactions    Paliperidone     Ziprasidone  Other Adverse Reaction (Add comment)     Tardive dyskinesia       Review of Systems:  Ear/Nose/Mouth/Throat: left greater than right ear wax and decreased hearing  All other systems reviewed and found to be negative.    Physical Exam:  Blood pressure 124/79, pulse 59, temperature 36.4 C (97.6 F), temperature source Thermal Scan, height 1.778 m (5\' 10" ), weight 88.5 kg (195 lb).  Body mass index is 27.98 kg/m.  General Appearance: Pleasant, cooperative, healthy, and in no acute  distress.  Eyes: Conjunctivae/corneas clear, PERRLA, EOM's intact.  Head and Face: Normocephalic, atraumatic.  Face symmetric, no obvious lesions.   Pinnae: Normal shape and position. No tragal or mastoid swelling or tenderness with palpation.   External auditory canals: Bilateral left>right cerumen impaction - see below procedure note. No seen inflammation or drainage.  Tympanic membranes: visualized TMs appear intact with mild scattered cerumen on left TM, midposition, middle ear aerated. Type A, AU  Nose:  External pyramid midline. No seen purulence, polyps, or crusts.   Oral Cavity/Oropharynx: No mucosal lesions, masses, or pharyngeal asymmetry. No seen PND.   Neck:  No palpable thyroid, salivary gland, or neck masses.  Cardiovascular:  Good perfusion of upper extremities.  No cyanosis of the hands or fingers.  Lungs: No apparent stridorous breathing. No acute distress.  Skin: Skin warm and dry.  Neurologic: Cranial nerves:  grossly intact.  Psychiatric:  Alert and oriented x 3.      ENT, [facility]  [address]  [place] [address]  Operated by [facility]  Procedure Note    Name: Vincent [place] MRN:  [id number]   Date: 12/12/2022 DOB:  01-19-1951 (72 y.o.)         Ear Cerumen Removal    Performed by: Spero Curb, PA-C  Authorized by: Spero Curb, PA-C    Consent:     Consent obtained:  Verbal    Consent given by:  Patient (and daughter)    Risks, benefits, and alternatives were discussed: yes      Risks discussed:  Bleeding, pain, TM perforation and incomplete removal    Alternatives discussed:  Alternative treatment and no treatment  Procedure details:     Location:  R ear and L ear    Procedure type: curette      Procedure type comment:  Suction using binocular microscope.  Post-procedure details:     Inspection:  TM intact    Procedure completion:  Tolerated well, no immediate complications      Spero Curb, PA-C        Assessment:     ICD-10-CM    1. Bilateral impacted cerumen  H61.23 Ear Cerumen Removal      2. Decreased hearing, bilateral  H91.93 Refer to [facility]      3. Hearing loss  H91.90           Plan: Bilateral cerumen removal discussed/desired over other alternatives performed at time of visit with verbal consent by patient and daughter  without complications or current signs of effusion or infection in either ear.   Recommend instill at least 5 drops of Debrox ear drops or baby/sweet/mineral oil into each ear once weekly to every other week for cerumen prevention. Allow drops to soak for at least 10-15 minutes. Remove excess with towel or cloth. No ear flushing or Qtip use recommended.    Discussed/desired audiogram for ear and hearing assessment with order placed to be completed in the near future.   Patient and daughter prefers follow-up with ENT as needed for future cerumen management with then recommended monitoring by PCP or if any concerning audiogram results as discussed.  Patient and daughter expressed an understanding and agreement with plan without further questions at this time.    Orders Placed This Encounter    Ear Cerumen Removal    Refer to [facility]       Sandi Carne, PA-C  Supervising physicians: Alric Seton, MD, Jayme Cloud, MD and Marin Roberts, MD  [facility]    You can see your note(s) in [facility] so you may be able to see results before I do so please give at least 2-3 business days for review. Please have this understanding that NOT all abnormal results are significant. Our office will contact you for any urgent or emergent action if necessary. If you have any questions or concerns, feel free to contact ENT office.      Portions of this note may be dictated using voice recognition software or a dictation service. Variances in spelling and vocabulary are possible and unintentional. Not all errors are caught/corrected. Please notify the [facility] if any discrepancies are noted  or if the meaning of any statement is not clear.

## 2022-12-12 NOTE — Procedures (Signed)
[facility]  [address]  [place] [address]  Operated by [facility]  Procedure Note    Name: Vincent Powell MRN:  [id number]   Date: 12/12/2022 DOB:  1950-06-27 (73 y.o.)         Ear Cerumen Removal    Performed by: Spero Curb, PA-C  Authorized by: Spero Curb, PA-C    Consent:     Consent obtained:  Verbal    Consent given by:  Patient (and daughter)    Risks, benefits, and alternatives were discussed: yes      Risks discussed:  Bleeding, pain, TM perforation and incomplete removal    Alternatives discussed:  Alternative treatment and no treatment  Procedure details:     Location:  R ear and L ear    Procedure type: curette      Procedure type comment:  Suction using binocular microscope.  Post-procedure details:     Inspection:  TM intact    Procedure completion:  Tolerated well, no immediate complications      Spero Curb, PA-C

## 2022-12-12 NOTE — Patient Instructions (Addendum)
Ears cleaned. No current signs of ear fluid or infection in either ear.   Recommend instill at least 5 drops of Debrox ear drops or baby/sweet/mineral oil into each ear once weekly to every other week for wax prevention. Allow drops to soak for at least 10-15 minutes. Remove excess with towel or cloth. No ear flushing or Qtip use recommended.    Discussed/desired audiogram for ear and hearing assessment in the near future.

## 2022-12-14 NOTE — Addendum Note (Signed)
Addended by: Lillie Fragmin on: 12/14/2022 09:44 AM     Modules accepted: Orders

## 2022-12-24 ENCOUNTER — Ambulatory Visit (HOSPITAL_COMMUNITY): Payer: Self-pay | Admitting: Audiologist

## 2022-12-26 ENCOUNTER — Ambulatory Visit (INDEPENDENT_AMBULATORY_CARE_PROVIDER_SITE_OTHER): Payer: Self-pay | Admitting: Family Medicine

## 2022-12-26 ENCOUNTER — Encounter (INDEPENDENT_AMBULATORY_CARE_PROVIDER_SITE_OTHER): Payer: Self-pay

## 2022-12-26 VITALS — BP 124/54 | HR 64 | Temp 98.3°F | Resp 12 | Ht 70.0 in | Wt 200.0 lb

## 2022-12-26 DIAGNOSIS — J069 Acute upper respiratory infection, unspecified: Secondary | ICD-10-CM

## 2022-12-26 MED ORDER — PREDNISONE 20 MG TABLET
20.0000 mg | ORAL_TABLET | Freq: Every day | ORAL | 0 refills | Status: AC
Start: 2022-12-26 — End: 2023-01-02

## 2022-12-26 MED ORDER — AZITHROMYCIN 250 MG TABLET
ORAL_TABLET | ORAL | 0 refills | Status: DC
Start: 2022-12-26 — End: 2023-01-04

## 2022-12-26 NOTE — Progress Notes (Signed)
[facility]  [address]  [place] [address]    Progress Note    Encounter Date: 12/26/2022    Patient ID:  Vincent Powell  EGB:[id number]    DOB: December 04, 1950  Age: 72 y.o. male  Subjective   Subjective:     Chief Complaint   Patient presents with    Feel Sick     Productive cough in a few weeks       HPI    Patient is a 72 y/o male w/ h/o anxiety and DM Type 2. The patient presents with a chief complaint of symptoms consistent with pneumonia. They report having pneumonia four or five times in the past, with the most recent hospitalization occurring in July or early August. The patient has a history of dysphagia and is currently on a mechanically softened diet. They were previously on thickened liquids but were taken off a couple of months ago.    The patient denies any sore throat or shortness of breath but reports a cough with green, clumpy sputum. They describe minimal wheezing and can hear themselves breathing slightly. The patient denies any history of asthma or smoking.    The patient is currently using Flonase but is not taking any cough syrup or medication to thin the mucus.      Current Outpatient Medications   Medication Sig    aspirin 81 mg Oral Tablet, Chewable Chew 1 Tablet Once a day    azithromycin (ZITHROMAX) 250 mg Oral Tablet Take 500 mg (2 tab) on day 1; take 250 mg (1 tab) on days 2-5.    carbidopa-levodopa (SINEMET CR) 25-100 mg Oral Tablet Sustained Release Take 2 Tablets by mouth    clonazePAM (KLONOPIN) 1 mg Oral Tablet Take 1 Tablet (1 mg total) by mouth    cloNIDine HCL (CATAPRES) 0.1 mg Oral Tablet Take 1 Tablet (0.1 mg total) by mouth    divalproex (DEPAKOTE ER) 500 mg Oral Tablet Sustained Release 24 hr 2 Tablets (1,000 mg total)    glipiZIDE (GLUCOTROL XL) 10 mg Oral Tablet Extended Rel 24 hr Take 1 Tablet (10 mg total) by mouth Once a day    Levothyroxine 50 mcg Oral Capsule Take 1 Capsule (50 mcg total) by mouth    lisinopriL  (PRINIVIL) 5 mg Oral Tablet Take 1 Tablet (5 mg total) by mouth    memantine (NAMENDA XR) 28 mg Oral capsule,sprinkle,ER 24hr Take 1 Capsule (28 mg total) by mouth    memantine (NAMENDA) 10 mg Oral Tablet Take 10 mg by mouth Twice daily    OLANZapine (ZYPREXA) 2.5 mg Oral Tablet Take 1 Tablet (2.5 mg total) by mouth    omeprazole (PRILOSEC) 20 mg Oral Capsule, Delayed Release(E.C.) Take 1 Capsule (20 mg total) by mouth    pravastatin (PRAVACHOL) 40 mg Oral Tablet 1 Tablet (40 mg total)    predniSONE (DELTASONE) 20 mg Oral Tablet Take 1 Tablet (20 mg total) by mouth Once a day for 7 days    Triamcinolone Acetonide (NASACORT) 55 mcg Nasal Aerosol, Spray Administer 2 Sprays into affected nostril(s) Once a day for 180 days     Allergies   Allergen Reactions    Paliperidone     Ziprasidone  Other Adverse Reaction (Add comment)     Tardive dyskinesia     Past Medical History:   Diagnosis Date    Anxiety     Diabetes mellitus, type 2 (CMS HCC)     HTN  Hypercholesterolemia     NPH (normal pressure hydrocephalus) (CMS HCC)          Past Surgical History:   Procedure Laterality Date    HX APPENDECTOMY      HX TONSILLECTOMY           Family Medical History:    None         Social History     Tobacco Use    Smoking status: Never    Smokeless tobacco: Never   Substance Use Topics    Alcohol use: No    Drug use: No       Review of Systems   Constitutional:  Negative for activity change, appetite change, chills, diaphoresis, fatigue, fever and unexpected weight change.   HENT:  Negative for congestion, facial swelling, mouth sores, nosebleeds, postnasal drip, rhinorrhea, sinus pressure, sinus pain, sneezing, sore throat, trouble swallowing and voice change.    Respiratory:  Negative for apnea, cough, choking, chest tightness, shortness of breath, wheezing and stridor.    Cardiovascular:  Negative for chest pain, palpitations and leg swelling.   Gastrointestinal:  Negative for abdominal distention, abdominal pain, anal  bleeding, blood in stool, constipation, diarrhea, nausea, rectal pain and vomiting.   Endocrine: Negative for cold intolerance, heat intolerance, polydipsia, polyphagia and polyuria.   Musculoskeletal:  Negative for arthralgias, back pain, gait problem, joint swelling, myalgias, neck pain and neck stiffness.   Skin:  Negative for color change, pallor, rash and wound.   Allergic/Immunologic: Negative for environmental allergies, food allergies and immunocompromised state.   Neurological:  Negative for dizziness, tremors, seizures, syncope, facial asymmetry, speech difficulty, weakness, light-headedness, numbness and headaches.   Hematological:  Negative for adenopathy. Does not bruise/bleed easily.   Psychiatric/Behavioral:  Negative for agitation, behavioral problems, confusion, decreased concentration, dysphoric mood, hallucinations, self-injury, sleep disturbance and suicidal ideas. The patient is not nervous/anxious and is not hyperactive.         Objective   Objective:   Vitals: BP (!) 124/54   Pulse 64   Temp 36.8 C (98.3 F)   Resp 12   Ht 1.778 m (5\' 10" )   Wt 90.7 kg (200 lb)   SpO2 94%   BMI 28.70 kg/m         Physical Exam  Constitutional:       Appearance: Normal appearance. He is normal weight.   HENT:      Head: Normocephalic and atraumatic.      Nose: Nose normal.      Mouth/Throat:      Pharynx: Oropharynx is clear.   Cardiovascular:      Rate and Rhythm: Normal rate and regular rhythm.      Pulses: Normal pulses.      Heart sounds: Normal heart sounds. No murmur heard.     No gallop.   Pulmonary:      Effort: Pulmonary effort is normal. No respiratory distress.      Breath sounds: Normal breath sounds.   Musculoskeletal:         General: Normal range of motion.      Cervical back: Normal range of motion.   Neurological:      General: No focal deficit present.      Mental Status: He is alert and oriented to person, place, and time. Mental status is at baseline.   Psychiatric:         Mood and  Affect: Mood normal.         Behavior: Behavior  normal.         Thought Content: Thought content normal.         Judgment: Judgment normal.          Assessment & Plan:     ENCOUNTER DIAGNOSES     ICD-10-CM   1. Upper respiratory tract infection, unspecified type  J06.9     URI  - Plan:    - Prescribe antibiotic for suspected pneumonia.    - Consider a chest X-ray if symptoms do not improve or worsen.    Dysphagia  - Plan:    - Continue with the mechanically softened diet.    - Monitor for any changes in swallowing ability or worsening symptoms.    Possible Aspiration  - Plan:    - Educate the patient on proper swallowing techniques and precautions to minimize aspiration risk.    - Consider a referral to a speech therapist for further evaluation and management if needed.    Cough and Mucus Production  - Plan:    - Recommend over-the-counter Mucinex DM or Robitussin DM to help thin mucus and suppress cough.    - Monitor for any changes in symptoms or worsening cough.    Medication Allergies  - Plan:    - Continue to document and update the patient's medication allergy status.    Pharmacy  - Plan:    - Use [facility] on [address] for prescriptions.        Portions of this note may be dictated using voice recognition software or a dictation service. Variances in spelling and vocabulary are possible and unintentional. Not all errors are caught/corrected. Please notify the [facility] if any discrepancies are noted, or if the meaning of any statement is not clear.    Orders Placed This Encounter    azithromycin (ZITHROMAX) 250 mg Oral Tablet    predniSONE (DELTASONE) 20 mg Oral Tablet       No follow-ups on file.    I am scribing for, and in the presence of, Dr. Joella Prince for services provided on 12/26/2022.  Jerold Coombe, [place]

## 2023-01-04 ENCOUNTER — Other Ambulatory Visit: Payer: Self-pay

## 2023-01-04 ENCOUNTER — Encounter (INDEPENDENT_AMBULATORY_CARE_PROVIDER_SITE_OTHER): Payer: Self-pay | Admitting: Family Medicine

## 2023-01-04 ENCOUNTER — Ambulatory Visit (INDEPENDENT_AMBULATORY_CARE_PROVIDER_SITE_OTHER): Payer: Medicare Other | Admitting: Family Medicine

## 2023-01-04 ENCOUNTER — Encounter (INDEPENDENT_AMBULATORY_CARE_PROVIDER_SITE_OTHER): Payer: Medicare Other

## 2023-01-04 VITALS — BP 120/56 | HR 64 | Temp 100.2°F | Wt 200.0 lb

## 2023-01-04 DIAGNOSIS — R509 Fever, unspecified: Secondary | ICD-10-CM

## 2023-01-04 DIAGNOSIS — R918 Other nonspecific abnormal finding of lung field: Secondary | ICD-10-CM

## 2023-01-04 DIAGNOSIS — J989 Respiratory disorder, unspecified: Secondary | ICD-10-CM

## 2023-01-04 DIAGNOSIS — J189 Pneumonia, unspecified organism: Secondary | ICD-10-CM

## 2023-01-04 MED ORDER — AMOXICILLIN 875 MG-POTASSIUM CLAVULANATE 125 MG TABLET
1.0000 | ORAL_TABLET | Freq: Two times a day (BID) | ORAL | 0 refills | Status: AC
Start: 2023-01-04 — End: 2023-01-11

## 2023-01-04 NOTE — Nursing Note (Signed)
BP (!) 120/56   Pulse 64   Temp 37.9 C (100.2 F)   Wt 90.7 kg (200 lb)   SpO2 98%   BMI 28.70 kg/m

## 2023-01-04 NOTE — Progress Notes (Signed)
[facility]  [address]  [place] [address]    Progress Note    Name: Vincent Powell MRN:  [id number]   Date: 01/04/2023 DOB:  12/31/1950 (72 y.o.)             Chief Complaint: Cough, Fever, and Congestion  Subjective   72 y.o. male presents with cough, chest congestion, wheezing and fever  Was seen 2 weeks ago for the same by PCP and prescribed azithromycin and prednisone  He reports slight improvement but never completely cleared  He has history of dysphagia, he is on mechanically soft diet but it does not recall any specific time when he may have aspirated   He denies any fever, chills or sweats at home  Recently came back home from a nursing home where he was getting therapy    Past Medical History  Current Outpatient Medications   Medication Sig    amoxicillin-pot clavulanate (AUGMENTIN) 875-125 mg Oral Tablet Take 1 Tablet by mouth Twice daily for 7 days    aspirin 81 mg Oral Tablet, Chewable Chew 1 Tablet (81 mg total) Once a day    carbidopa-levodopa (SINEMET CR) 25-100 mg Oral Tablet Sustained Release Take 2 Tablets by mouth    clonazePAM (KLONOPIN) 1 mg Oral Tablet Take 1 Tablet (1 mg total) by mouth    cloNIDine HCL (CATAPRES) 0.1 mg Oral Tablet Take 1 Tablet (0.1 mg total) by mouth    divalproex (DEPAKOTE ER) 500 mg Oral Tablet Sustained Release 24 hr 2 Tablets (1,000 mg total)    glipiZIDE (GLUCOTROL XL) 10 mg Oral Tablet Extended Rel 24 hr Take 1 Tablet (10 mg total) by mouth Once a day    Levothyroxine 50 mcg Oral Capsule Take 1 Capsule (50 mcg total) by mouth    lisinopriL (PRINIVIL) 5 mg Oral Tablet Take 1 Tablet (5 mg total) by mouth    memantine (NAMENDA XR) 28 mg Oral capsule,sprinkle,ER 24hr Take 1 Capsule (28 mg total) by mouth    memantine (NAMENDA) 10 mg Oral Tablet Take 1 Tablet (10 mg total) by mouth Twice daily    OLANZapine (ZYPREXA) 2.5 mg Oral Tablet Take 1 Tablet (2.5 mg total) by mouth    omeprazole (PRILOSEC) 20 mg Oral Capsule, Delayed Release(E.C.) Take 1  Capsule (20 mg total) by mouth    pravastatin (PRAVACHOL) 40 mg Oral Tablet 1 Tablet (40 mg total)    Triamcinolone Acetonide (NASACORT) 55 mcg Nasal Aerosol, Spray Administer 2 Sprays into affected nostril(s) Once a day for 180 days     Allergies   Allergen Reactions    Paliperidone     Ziprasidone  Other Adverse Reaction (Add comment)     Tardive dyskinesia     Past Medical History:   Diagnosis Date    Anxiety     Diabetes mellitus, type 2 (CMS HCC)     HTN     Hypercholesterolemia     NPH (normal pressure hydrocephalus) (CMS HCC)          Past Surgical History:   Procedure Laterality Date    HX APPENDECTOMY      HX TONSILLECTOMY           Family Medical History:    None         Social History     Socioeconomic History    Marital status: Married   Tobacco Use    Smoking status: Never    Smokeless tobacco: Never   Substance and Sexual  Activity    Alcohol use: No    Drug use: No    Sexual activity: Not Currently     Social Determinants of Health     Transportation Needs: No Transportation Needs (09/18/2022)    Received from [facility]    Transportation     In the past 12 months, has lack of reliable transportation kept you from medical appointments, meetings, work or from getting things needed for daily living? : No    Received from [facility]    Housing Stability     ROS: Per HPI above and no other pertinent symptoms    Objective   BP (!) 120/56   Pulse 64   Temp 37.9 C (100.2 F)   Wt 90.7 kg (200 lb)   SpO2 98%   BMI 28.70 kg/m     General: no distress   Eyes: Conjunctiva clear. PERRLA, no discharge  HEENT: mucous membranes moist, Oropharynx without erythema or exudate, TM's Clear  Neck: no adenopathy  Lungs:  Decreased air movement in right lower lobe area otherwise clear, no wheezing  Cardiovascular: Heart regular rate and rhythm      Data reviewed:  PCP progress note 12/26/2022    Current Outpatient Medications   Medication Sig    amoxicillin-pot clavulanate (AUGMENTIN) 875-125 mg Oral  Tablet Take 1 Tablet by mouth Twice daily for 7 days    aspirin 81 mg Oral Tablet, Chewable Chew 1 Tablet (81 mg total) Once a day    carbidopa-levodopa (SINEMET CR) 25-100 mg Oral Tablet Sustained Release Take 2 Tablets by mouth    clonazePAM (KLONOPIN) 1 mg Oral Tablet Take 1 Tablet (1 mg total) by mouth    cloNIDine HCL (CATAPRES) 0.1 mg Oral Tablet Take 1 Tablet (0.1 mg total) by mouth    divalproex (DEPAKOTE ER) 500 mg Oral Tablet Sustained Release 24 hr 2 Tablets (1,000 mg total)    glipiZIDE (GLUCOTROL XL) 10 mg Oral Tablet Extended Rel 24 hr Take 1 Tablet (10 mg total) by mouth Once a day    Levothyroxine 50 mcg Oral Capsule Take 1 Capsule (50 mcg total) by mouth    lisinopriL (PRINIVIL) 5 mg Oral Tablet Take 1 Tablet (5 mg total) by mouth    memantine (NAMENDA XR) 28 mg Oral capsule,sprinkle,ER 24hr Take 1 Capsule (28 mg total) by mouth    memantine (NAMENDA) 10 mg Oral Tablet Take 1 Tablet (10 mg total) by mouth Twice daily    OLANZapine (ZYPREXA) 2.5 mg Oral Tablet Take 1 Tablet (2.5 mg total) by mouth    omeprazole (PRILOSEC) 20 mg Oral Capsule, Delayed Release(E.C.) Take 1 Capsule (20 mg total) by mouth    pravastatin (PRAVACHOL) 40 mg Oral Tablet 1 Tablet (40 mg total)    Triamcinolone Acetonide (NASACORT) 55 mcg Nasal Aerosol, Spray Administer 2 Sprays into affected nostril(s) Once a day for 180 days        Assessment/Plan  Problem List Items Addressed This Visit    None  Visit Diagnoses       Pneumonia of right lower lobe due to infectious organism    -  Primary    Relevant Medications    amoxicillin-pot clavulanate (AUGMENTIN) 875-125 mg Oral Tablet    Respiratory illness with fever        Relevant Medications    amoxicillin-pot clavulanate (AUGMENTIN) 875-125 mg Oral Tablet    Other Relevant Orders    XR CHEST PA AND LATERAL (Completed)  Chest x-ray concerning for right lower lobe pneumonia on my review.  Radiologist's report notes acute on chronic changes.  He has been on azithromycin but  still has ongoing symptoms, ? aspiration due to his dysphagia.  Will treat him with Augmentin.  Discussed potential side effects.  His oxygen is 98% and he appears stable hence okay to treat at home.  If symptoms persist or worsen I recommend he goes to the ER for further evaluation.  He reports he had a negative flu and COVID test at his PCP's.  Call/return if any concerns or follow up with PCP and to ER for any emergent symptoms      Roxanne Gates, MD  01/04/2023, 17:01    Portions of this note may be dictated using voice recognition software or a dictation service. Variances in spelling and vocabulary are possible and unintentional. Not all errors are caught/corrected. Please notify the [facility] if any discrepancies are noted or if the meaning of any statement is not clear.

## 2023-01-07 ENCOUNTER — Encounter (INDEPENDENT_AMBULATORY_CARE_PROVIDER_SITE_OTHER): Payer: Self-pay | Admitting: NURSE PRACTITIONER

## 2023-01-16 NOTE — Progress Notes (Signed)
DATE OF SERVICE: 01/16/2023       PRIMARY CARE:  Dinah Beers, DO.    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 He has history of Parkinsonism.      He was a patient of Dr. Leanna Battles.  His note is pasted below for my future reference.  Patient has been finding very significant relief from injections initially by Dr. Dan Humphreys and then by Dr. Leanna Battles.  I am repeating the same strategy which has worked for him.  In the past he has had significant dysphagia, aspiration pneumonia with hospitalization.    Previous note by Dr. Leanna Battles  Since his last injection April 14 with 200 units Botox, he was hospitalized for what sounds like an aspiration pneumonia, for approximately 5 weeks.  He is now at [facility] going through rehabilitation.  He is on a pureed diet.  He is currently on Sinemet 25/100 mg 1-1/2 three times daily, [place]edo 6 mg twice daily has been added.  Also, remains on clonazepam 1 mg twice daily and Depakote 1000 mg at night.  He states his cognition has been clear and denies any hallucinations.  Does not wish to change the protocol on his injections.  He feels he has great difficulty in trying to ambulate with shuffling of his feet.    Clinical impression: Jaw clenching oromandibular dystonia    Type of botulinum toxin: Botox used: 200 units discarded 0 units    Procedure:  After obtaining informed consent, using aseptic technique, following muscles were injected    Bilateral masseters: 50+50 units (two injections in each masseter)  Bilateral temporalis: 50+50 units (Patient has a VP shunt)    Patient tolerated the procedure well    Patient will follow up in 3 months for repeat injections      Electronically signed by: Harriet Pho, MD 01/16/2023 8:30 AM    Anastasia Fiedler, MD, FAAN  Professor of Neurology  [facility]   [facility]   [place] [place]  [address]

## 2023-02-10 ENCOUNTER — Encounter (INDEPENDENT_AMBULATORY_CARE_PROVIDER_SITE_OTHER): Payer: Self-pay | Admitting: NURSE PRACTITIONER

## 2023-03-14 ENCOUNTER — Ambulatory Visit (HOSPITAL_BASED_OUTPATIENT_CLINIC_OR_DEPARTMENT_OTHER)
Admission: RE | Admit: 2023-03-14 | Discharge: 2023-03-14 | Disposition: A | Discharge status: Home / Self Care | Payer: Medicare Other | Source: Ambulatory Visit | Attending: PULMONARY DISEASE | Admitting: PULMONARY DISEASE

## 2023-03-14 ENCOUNTER — Encounter (INDEPENDENT_AMBULATORY_CARE_PROVIDER_SITE_OTHER): Payer: Self-pay | Admitting: PULMONARY DISEASE

## 2023-03-14 ENCOUNTER — Ambulatory Visit: Payer: Medicare Other | Attending: PULMONARY DISEASE | Admitting: PULMONARY DISEASE

## 2023-03-14 ENCOUNTER — Other Ambulatory Visit: Payer: Self-pay

## 2023-03-14 VITALS — BP 124/60 | HR 54 | Resp 16 | Ht 70.0 in | Wt 202.0 lb

## 2023-03-14 DIAGNOSIS — R0609 Other forms of dyspnea: Secondary | ICD-10-CM | POA: Insufficient documentation

## 2023-03-14 DIAGNOSIS — R9389 Abnormal findings on diagnostic imaging of other specified body structures: Secondary | ICD-10-CM | POA: Insufficient documentation

## 2023-03-14 DIAGNOSIS — Z8701 Personal history of pneumonia (recurrent): Secondary | ICD-10-CM | POA: Insufficient documentation

## 2023-03-14 DIAGNOSIS — T43505A Adverse effect of unspecified antipsychotics and neuroleptics, initial encounter: Secondary | ICD-10-CM | POA: Insufficient documentation

## 2023-03-14 DIAGNOSIS — R0602 Shortness of breath: Secondary | ICD-10-CM | POA: Insufficient documentation

## 2023-03-14 DIAGNOSIS — G2401 Drug induced subacute dyskinesia: Secondary | ICD-10-CM | POA: Insufficient documentation

## 2023-03-14 DIAGNOSIS — J449 Chronic obstructive pulmonary disease, unspecified: Secondary | ICD-10-CM

## 2023-03-14 NOTE — H&P (Signed)
PULMONARY, [facility] PULMONOLOGY  [address]  [place] [address]  Operated by [facility]     New Patient    Patient Name: Vincent Powell  Date: 03/14/2023  Department:  PULMONARY, [facility] PULMONOLOGY  MRN: [id number]  DOB: 1950/09/03  Primary Care Provider:  [facility]  Referring Provider:  Grafton Folk, DO      Chief Complaint:   Chief Complaint   Patient presents with    Shortness of Breath     Nursing Notes:   Bing Plume, [place]  03/14/23 9518  Signed  Is the patient currently on oxygen therapy? No  What Liter Flow is the patient on? N/A  What DME company does the patient utilize for oxygen therapy? N/A  What is the patient on? n/a  What DME company does the patient utilize for CPAP/BiPAP/Trilogy? N/A  Smoking History:    Social History     Tobacco Use   Smoking Status Never   Smokeless Tobacco Never      Have you received a flu shot? Yes  Have you received a pneumonia shot? Yes  Has the patient had any tests performed since the last visit? Chest Xray  If so, where were the test(s) performed? [facility]   Has the patient had any recent hospitalizations? No  Does the patient have any other associated symptoms or modifying factors? None  Bing Plume, MA            History of Present Illness:   Vincent Powell is a 73 y.o., White male presents today for dyspnea on exertion, referred here by Dr Doristine Devoid.  Was treated for pneumonia 2 to 3 times in the last several months.  Was concerned that he was aspirating, and did a swallow study around October and was on a pureed diet.  States he had a repeat swallow study that was normal, but he remains on a pureed diet.  Does note some intermittent aspiration symptoms.  Feels that after changing his diet he has not had any issues with pneumonia again.  Has normal pressure hydrocephalus and tardive dyskinesia.  Had a shunt placed less than a year ago.  Was very weak, but feels the strength has improved.   Life long non-smoker and prior to the weakness from the NPH, he was very strong.  Starting to get strength again.  Does get botox injections in his mouth for his TD, but does not note a correlation with the infections and this aspiration.  Dyspnea improving and nearly back to baseline.  NO major cough or wheezing.    No other associated symptoms or modifying factors.    CXR images from 01/04/2023 independently viewed:  Increased right base consolidation which radiology thinks could be acute or chronic.      PFT tracing today was independently viewed:  Mild restriction on both spirometry and lung volumes.    No immediate bronchodilator response.    Normal diffusion capacity.        Past Medical History  Past Medical History:   Diagnosis Date    Anxiety     Diabetes mellitus, type 2 (CMS HCC)     Esophageal reflux     HTN     Hypercholesterolemia     Hyperlipidemia     Hypothyroidism     NPH (normal pressure hydrocephalus) (CMS HCC)       Past Surgical History  Past Surgical History:   Procedure Laterality Date  HX APPENDECTOMY      HX COLONOSCOPY      HX TONSILLECTOMY       Medication List  Current Outpatient Medications   Medication Sig    aspirin 81 mg Oral Tablet, Chewable Chew 1 Tablet (81 mg total) Once a day    carbidopa-levodopa (SINEMET CR) 25-100 mg Oral Tablet Sustained Release Take 2 Tablets by mouth    clonazePAM (KLONOPIN) 1 mg Oral Tablet Take 1 Tablet (1 mg total) by mouth    cloNIDine HCL (CATAPRES) 0.1 mg Oral Tablet Take 1 Tablet (0.1 mg total) by mouth    divalproex (DEPAKOTE ER) 500 mg Oral Tablet Sustained Release 24 hr 2 Tablets (1,000 mg total)    glipiZIDE (GLUCOTROL XL) 10 mg Oral Tablet Extended Rel 24 hr Take 1 Tablet (10 mg total) by mouth Once a day    Levothyroxine 50 mcg Oral Capsule Take 1 Capsule (50 mcg total) by mouth    lisinopriL (PRINIVIL) 5 mg Oral Tablet Take 1 Tablet (5 mg total) by mouth (Patient not taking: Reported on 03/14/2023)    memantine (NAMENDA XR) 28 mg Oral  capsule,sprinkle,ER 24hr Take 1 Capsule (28 mg total) by mouth (Patient not taking: Reported on 03/14/2023)    memantine (NAMENDA) 10 mg Oral Tablet Take 1 Tablet (10 mg total) by mouth Twice daily    OLANZapine (ZYPREXA) 2.5 mg Oral Tablet Take 1 Tablet (2.5 mg total) by mouth    omeprazole (PRILOSEC) 20 mg Oral Capsule, Delayed Release(E.C.) Take 1 Capsule (20 mg total) by mouth    pravastatin (PRAVACHOL) 40 mg Oral Tablet 1 Tablet (40 mg total)    Triamcinolone Acetonide (NASACORT) 55 mcg Nasal Aerosol, Spray Administer 2 Sprays into affected nostril(s) Once a day for 180 days     Allergy List  Allergy History as of 03/14/23       ZIPRASIDONE         Noted Status Severity Type Reaction    01/04/11 1559 Kodali, Dheeraj 01/04/11 Active  Side Effect  Other Adverse Reaction (Add comment)    Comments: Tardive dyskinesia               PALIPERIDONE         Noted Status Severity Type Reaction    06/21/11 1313 Kodali, Dheeraj 06/21/11 Active                 TAMSULOSIN         Noted Status Severity Type Reaction    03/13/23 1625 Ginnie Smart, MA 07/10/21 Active Medium  Rash    Comments: Per MAR 09/19/2022               ZOLPIDEM         Noted Status Severity Type Reaction    03/13/23 1625 Ginnie Smart, MA 04/29/07 Active    Other Adverse Reaction (Add comment)    Comments: Excessively groggy     Excessively groggy                   Family History   Family Medical History:    None         Social History  Social History     Socioeconomic History    Marital status: Married   Tobacco Use    Smoking status: Never    Smokeless tobacco: Never   Substance and Sexual Activity    Alcohol use: No    Drug use: No    Sexual activity: Not Currently  Social Determinants of Health     Transportation Needs: No Transportation Needs (09/18/2022)    Received from [employer]     In the past 12 months, has lack of reliable transportation kept you from medical appointments, meetings, work or from getting things needed  for daily living? : No    Received from [facility]    Housing Stability        ROS negative other than above listed in HPI.    Vital Signs  Vitals:    03/14/23 0850   BP: 124/60   Pulse: 54   Resp: 16   SpO2: 98%   Weight: 91.6 kg (202 lb)   Height: 1.778 m (5\' 10" )   BMI: 28.98        PHYSICAL EXAMINATION:   Constitutional:  Vital signs stable.  General appearance of the patient:  Alert, no acute distress.    Neck: Supple with trachea midline, non tender, no nodules, no masses, gland position midline.  Respiratory:  Auscultation of lungs with normal breath sounds, no rales, no rhonchi, no wheezing.  Respiratory effort with no tractions, breathing regular and unlabored.  Cardiovascular:  Regular rhythm and regular rate.  No murmur, no peripheral edema.  Musculoskeletal:  Normal gait and station, normal digits, no digital cyanosis or clubbing.  Mental Status/Psychiatric:  Alert, grossly oriented to person, place, and time.  Appropriate and normal mood.    Assessment    ICD-10-CM    1. Shortness of breath  R06.02       2. Abnormal chest x-ray  R93.89       3. 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  Will check a HRCT chest to look for bronchiectasis and other issues that may be mimicking aspiration pneumonia.  Reviewed reports on Epic from 2020 at [facility], and had what sounds like right base infiltrates at that time as well.  FU in 7 weeks with HRCT chest.  If doing well, may consider advancing diet.  If continues to have pneumonia or concern for atypical infection in imaging, may consider a FOB with BAL at follow up, as there is somd concern for an atypical infection.        MDM (complete) I have personally managed 1 undiagnosed new problem with uncertain prognosis.  (Recurrent pneumonia)  I have reviewed and interpreted images as above.        Carmie End,  MD  03/14/2023 09:41    No follow-ups on file.    Instructions  Continue medications as prescribed/directed unless changed by provider.  Plan of care discussed with patient.      The patient was given the opportunity to ask questions and those questions were answered to the patient's satisfaction. The patient was encouraged to call with any additional questions or concerns. Discussed with the patient effects and side effects of medications. Medication safety was discussed.  The patient was informed to contact the office within 7 business days if a message/lab results/referral/imaging results have not been conveyed to the patient.    Electronically signed by Carmie End, MD  Pulmonary and Critical care    This note may have been partially generated using MModal Fluency Direct system, and there may  be some incorrect words, spellings, and punctuation that were not noted in checking the note before saving.

## 2023-03-14 NOTE — Nursing Note (Signed)
Is the patient currently on oxygen therapy? No  What Liter Flow is the patient on? N/A  What DME company does the patient utilize for oxygen therapy? N/A  What is the patient on? n/a  What DME company does the patient utilize for CPAP/BiPAP/Trilogy? N/A  Smoking History:    Social History     Tobacco Use   Smoking Status Never   Smokeless Tobacco Never      Have you received a flu shot? Yes  Have you received a pneumonia shot? Yes  Has the patient had any tests performed since the last visit? Chest Xray  If so, where were the test(s) performed? [facility]   Has the patient had any recent hospitalizations? No  Does the patient have any other associated symptoms or modifying factors? None  Bing Plume, [place]

## 2023-05-22 ENCOUNTER — Encounter (INDEPENDENT_AMBULATORY_CARE_PROVIDER_SITE_OTHER): Payer: Self-pay | Admitting: PHYSICIAN ASSISTANT

## 2023-05-22 ENCOUNTER — Ambulatory Visit (HOSPITAL_BASED_OUTPATIENT_CLINIC_OR_DEPARTMENT_OTHER)
Admission: RE | Admit: 2023-05-22 | Discharge: 2023-05-22 | Disposition: A | Discharge status: Home / Self Care | Payer: Self-pay | Source: Ambulatory Visit | Attending: PULMONARY DISEASE | Admitting: Radiology

## 2023-05-22 ENCOUNTER — Other Ambulatory Visit: Payer: Self-pay

## 2023-05-22 ENCOUNTER — Ambulatory Visit: Payer: Self-pay | Attending: PHYSICIAN ASSISTANT | Admitting: PHYSICIAN ASSISTANT

## 2023-05-22 VITALS — BP 148/64 | HR 108 | Temp 98.6°F | Resp 18 | Ht 71.0 in | Wt 190.0 lb

## 2023-05-22 DIAGNOSIS — Z8701 Personal history of pneumonia (recurrent): Secondary | ICD-10-CM | POA: Insufficient documentation

## 2023-05-22 DIAGNOSIS — R0602 Shortness of breath: Secondary | ICD-10-CM | POA: Insufficient documentation

## 2023-05-22 DIAGNOSIS — J479 Bronchiectasis, uncomplicated: Secondary | ICD-10-CM

## 2023-05-22 DIAGNOSIS — R1310 Dysphagia, unspecified: Secondary | ICD-10-CM

## 2023-05-22 DIAGNOSIS — R9389 Abnormal findings on diagnostic imaging of other specified body structures: Secondary | ICD-10-CM

## 2023-05-22 NOTE — Nursing Note (Signed)
Is the patient currently on oxygen therapy? No  What Liter Flow is the patient on? N/A  What DME company does the patient utilize for oxygen therapy? N/A  What is the patient on? n/a  What DME company does the patient utilize for CPAP/BiPAP/Trilogy? N/A  Smoking History:    Social History     Tobacco Use   Smoking Status Never   Smokeless Tobacco Never      Have you received a flu shot? Yes  Have you received a pneumonia shot? Yes  Has the patient had any tests performed since the last visit? CT  If so, where were the test(s) performed?  [facility]  Has the patient had any recent hospitalizations? No  Does the patient have any other associated symptoms or modifying factors? None    Bradden Tadros, MA

## 2023-05-22 NOTE — Progress Notes (Signed)
 PULMONARY, [facility] PULMONOLOGY  [address]  [place] [address]  Operated by [facility]     Follow up/Progress Note    Patient Name: Vincent Powell  Date: 05/22/2023  Department:  PULMONARY, [facility] PULMONOLOGY  MRN: [id number]  DOB: 01/18/1951  Primary Care Provider:  Ellouise Haagensen, DO  Referring Provider:  Ellouise Haagensen, DO      Chief Complaint:   Chief Complaint   Patient presents with    Shortness of Breath     CT@[facility]      Nursing NotesMarlene Simas, MA  05/22/23 1430  Signed  Is the patient currently on oxygen therapy? No  What Liter Flow is the patient on? N/A  What DME company does the patient utilize for oxygen therapy? N/A  What is the patient on? n/a  What DME company does the patient utilize for CPAP/BiPAP/Trilogy? N/A  Smoking History:    Social History     Tobacco Use   Smoking Status Never   Smokeless Tobacco Never      Have you received a flu shot? Yes  Have you received a pneumonia shot? Yes  Has the patient had any tests performed since the last visit? CT  If so, where were the test(s) performed?  [facility]  Has the patient had any recent hospitalizations? No  Does the patient have any other associated symptoms or modifying factors? None    Chastity Good, MA            HPI:    Patient with SOB is here for initial follow-up visit. At last visit, 03/14/2023 he reported that he was treated for pneumonia 2 to 3 times in the last several months. There was a concerned for aspiration. He completed a swallow study in 11/2022 and placed on a pureed diet.  States he had a repeat swallow study that was normal, but he remains on a pureed diet.  Does note some intermittent aspiration symptoms.  Today, he states that he had aspiration pneumonia 1 month ago that was diagnosed via chest x-ray.  He states that his PCP gave him 2 rounds of antibiotics.  He reports today that he feels like he recovered.  He reports no shortness of breath, wheezing, or coughing.  He states  that that the plan is to continue with just pureed foods to prevent another aspiration event.    Patient has normal pressure hydrocephalus and tardive dyskinesia.  Had a shunt placed less than a year ago. Life long non-smoker.  CXR 01/04/2023 showed increased right base consolidation. PFT last visit showed mild restriction on both spirometry and lung volumes.  HRCT today shows Bronchiectasis, greatest within the right lower lobe, with bronchial wall thickening and scarring/atelectasis, primarily within the anterior segment right lower lobe, similar to the prior study. Improvement of the bronchial wall thickening and fluid/mucus within dilated right lower lobe bronchi.         Past Medical History:  Past Medical History:   Diagnosis Date    Anxiety     Diabetes mellitus, type 2 (CMS HCC)     Esophageal reflux     HTN     Hypercholesterolemia     Hyperlipidemia     Hypothyroidism     NPH (normal pressure hydrocephalus) (CMS HCC)          Past Surgical History  Past Surgical History:   Procedure Laterality Date    HX APPENDECTOMY      HX  COLONOSCOPY      HX TONSILLECTOMY         Medication List  Current Outpatient Medications   Medication Sig    aspirin 81 mg Oral Tablet, Chewable Chew 1 Tablet (81 mg total) Daily    carbidopa-levodopa (SINEMET CR) 25-100 mg Oral Tablet Sustained Release Take 2 Tablets by mouth    clonazePAM (KLONOPIN) 1 mg Oral Tablet Take 1 Tablet (1 mg total) by mouth    cloNIDine HCL (CATAPRES) 0.1 mg Oral Tablet Take 1 Tablet (0.1 mg total) by mouth    divalproex (DEPAKOTE ER) 500 mg Oral Tablet Sustained Release 24 hr 2 Tablets (1,000 mg total)    glipiZIDE (GLUCOTROL XL) 10 mg Oral Tablet Extended Rel 24 hr Take 1 Tablet (10 mg total) by mouth Daily    Levothyroxine 50 mcg Oral Capsule Take 1 Capsule (50 mcg total) by mouth    memantine (NAMENDA) 10 mg Oral Tablet Take 1 Tablet (10 mg total) by mouth Twice daily    OLANZapine (ZYPREXA) 2.5 mg Oral Tablet Take 1 Tablet (2.5 mg total) by mouth     omeprazole (PRILOSEC) 20 mg Oral Capsule, Delayed Release(E.C.) Take 1 Capsule (20 mg total) by mouth    pravastatin (PRAVACHOL) 40 mg Oral Tablet 1 Tablet (40 mg total)    Triamcinolone  Acetonide (NASACORT ) 55 mcg Nasal Aerosol, Spray Administer 2 Sprays into affected nostril(s) Once a day for 180 days     Allergy List  Allergy History as of 05/22/23       ZIPRASIDONE         Noted Status Severity Type Reaction    01/04/11 1559 Kodali, Dheeraj 01/04/11 Active  Side Effect  Other Adverse Reaction (Add comment)    Comments: Tardive dyskinesia               PALIPERIDONE         Noted Status Severity Type Reaction    06/21/11 1313 Kodali, Dheeraj 06/21/11 Active                 TAMSULOSIN         Noted Status Severity Type Reaction    03/13/23 1625 Glena Landau, MA 07/10/21 Active Medium  Rash    Comments: Per MAR 09/19/2022               ZOLPIDEM         Noted Status Severity Type Reaction    03/13/23 1625 Glena Landau, MA 04/29/07 Active    Other Adverse Reaction (Add comment)    Comments: Excessively groggy     Excessively groggy                   Family History   Family Medical History:    None         Social History  Social History     Socioeconomic History    Marital status: Married   Tobacco Use    Smoking status: Never    Smokeless tobacco: Never   Vaping Use    Vaping status: Never Used   Substance and Sexual Activity    Alcohol use: No    Drug use: No    Sexual activity: Not Currently     Social Determinants of Health     Transportation Needs: No Transportation Needs (09/18/2022)    Received from [employer]     In the past 12 months, has lack of reliable transportation kept you from medical appointments,  meetings, work or from getting things needed for daily living? : No    Received from [facility]    Housing Stability          Objective:  Vital Signs  BP (!) 148/64 (Site: Right Arm, Patient Position: Sitting, Cuff Size: Adult)   Pulse (!) 108   Temp 37 C (98.6 F)  (Oral)   Resp 18   Ht 1.803 m (5\' 11" )   Wt 86.2 kg (190 lb)   SpO2 94%   BMI 26.50 kg/m          PHYSICAL EXAMINATION:   Constitutional:  Vital signs stable.  General appearance of the patient:  Alert, no acute distress.  Normal appearance, well nourished.  Ears, Nose, Mouth, and Throat: External inspection of ears and nose with normal appearance.  Inspection of lips, teeth and gums with normal appearance and oral mucosa normal.  Neck: Supple with trachea midline.  Respiratory:  Auscultation of lungs with normal breath sounds, no rales, no rhonchi, no wheezing.  Respiratory effort with no tractions, breathing regular and unlabored.  Cardiovascular:  Regular rhythm and regular rate.    Gastrointestinal: Abdomen non-distended.  Musculoskeletal:  Normal gait and station, normal digits, no digital cyanosis or clubbing.  Mental Status/Psychiatric:  Alert, grossly oriented to person, place, and time.  Appropriate and normal mood.    Assessment    ICD-10-CM    1. H/O recurrent pneumonia  Z87.01       2. Shortness of breath  R06.02           Imaging  CT LUNGS HIGH RESOLUTION WO IV CONTRAST  Narrative: Vincent Powell    CT LUNGS HIGH RESOLUTION WO IV CONTRAST, 05/22/2023 2:18 PM    INDICATION: R06.02: Shortness of breath  R93.89: Abnormal chest x-ray  Z87.01: H/O recurrent pneumonia; Shortness of breath, Abnormal chest x-ray, H/O recurrent pneumonia    TECHNIQUE:  Multiplanar high-resolution CT imaging of the thorax without intravenous contrast.  Dose modulation, automated exposure control, and/or iterative reconstruction technique used for dose reduction.    * No acute bony abnormality.    * Catheter overlies the right chest, extending to the abdomen, compatible with a ventriculoperitoneal drain, new compared with 01/13/2020.  * Gynecomastia.  * Heart size upper limits of normal. Aortic and coronary atherosclerosis.  * Calcified mediastinal and right hilar lymph nodes, no change 01/13/2020.    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  * Low-density lesions compatible with cysts, with calcification on the left, indeed noted November 2021.    * Elevation right hemidiaphragm.  * Bronchiectasis, greatest within the right lower lobe, with bronchial wall thickening and scarring/atelectasis, primarily within the anterior segment right lower lobe, similar to the prior study.  * Improvement of the bronchial wall thickening and fluid/mucus within dilated right lower lobe bronchi.  Impression: * IMPRESSION:    * Right lower lobe bronchiectasis, improvement of fluid/mucus within dilated bronchi when compared with November 2021    Radiologist location ID: [id number]      Plan  HRCT reviewed and discussed with patient.  CT shows right lower lobe bronchiectasis and improvement of fluid/mucus within dilated bronchi.  Patient seems to be doing well from a pulmonary standpoint.  Advised him to follow with the pureed food diet to prevent another aspiration event.    Patient was seen and examined by Dr. Harvest Lineman.  He was present for the majority of the visit.  He is in agreement with  the note and plan unless otherwise noted on his addendum.      Return in about 28 weeks (around 12/04/2023) for In Dr. Valora Gear, With Me.     The patient was given the opportunity to ask questions and those questions were answered to the patient's satisfaction. The patient was encouraged to call with any additional questions or concerns. Discussed with the patient effects and side effects of medications. Medication safety was discussed.  The patient was informed to contact the office within 7 business days if a message/lab results/referral/imaging results have not been conveyed to the patient.    Electronically signed by Garrick Kanaris, PA-C  _______________________________    I personally saw and evaluated the patient as part of a shared service with an APP.    My substantive findings are:  MDM (complete) I have personally managed 2 or more stable  chronic illnesses (recurrent pneumonia, dysphagia)  I have reviewed and independently interpreted CT images listed below.      CT chest images today reviewed and compared to images from 2022:  Stable to improved RLL focal bronchiectasis.    CT chest stable.  Did have a pneumonia.  Question continued intermittent aspiration vs bronchiectasis flair.  Will consider nebs and flutter valve if continues to have issues.  Continue supportive care.  Dysphagia stable and rarely has issues.  Remains on a modified diet.      Mindy Alu, MD  05/22/2023 15:57      This note may have been partially generated using MModal Fluency Direct system, and there may be some incorrect words, spellings, and punctuation that were not noted in checking the note before saving.

## 2023-12-04 ENCOUNTER — Other Ambulatory Visit: Payer: Self-pay

## 2023-12-04 ENCOUNTER — Encounter (INDEPENDENT_AMBULATORY_CARE_PROVIDER_SITE_OTHER): Payer: Self-pay

## 2023-12-04 ENCOUNTER — Ambulatory Visit: Payer: Self-pay

## 2023-12-04 VITALS — BP 122/58 | HR 92 | Temp 97.4°F | Resp 16 | Ht 71.0 in | Wt 170.0 lb

## 2023-12-04 DIAGNOSIS — J479 Bronchiectasis, uncomplicated: Secondary | ICD-10-CM | POA: Insufficient documentation

## 2023-12-04 DIAGNOSIS — J690 Pneumonitis due to inhalation of food and vomit: Secondary | ICD-10-CM

## 2023-12-04 DIAGNOSIS — Z8701 Personal history of pneumonia (recurrent): Secondary | ICD-10-CM | POA: Insufficient documentation

## 2023-12-04 DIAGNOSIS — R0609 Other forms of dyspnea: Secondary | ICD-10-CM | POA: Insufficient documentation

## 2023-12-04 NOTE — Progress Notes (Signed)
 PULMONARY, [facility] PULMONOLOGY  [address]  [place] [address]  Operated by [facility]     Follow up/Progress Note    Patient Name: Vincent Powell  Date: 12/04/2023  Department:  PULMONARY, [facility] PULMONOLOGY  MRN: [id number]  DOB: 20-Aug-1950  Primary Care Provider:  Sherida JINNY Hoit, DO  Referring Provider:  Sherida JINNY Hoit, DO      Chief Complaint:   Chief Complaint   Patient presents with    Pneumonia    Shortness of Breath    Follow Up     Nursing Notes:   Dayne Hila, [place]  12/04/23 1415  Signed  Is the patient currently on oxygen therapy? No  What Liter Flow is the patient on? N/A  What DME company does the patient utilize for oxygen therapy? N/A  What is the patient on? n/a  What DME company does the patient utilize for CPAP/BiPAP/Trilogy? N/A  Smoking History:    Tobacco Use History[1]   Have you received a flu shot? Yes  Have you received a pneumonia shot? Yes  Has the patient had any tests performed since the last visit? None  If so, where were the test(s) performed? N/A  Has the patient had any recent hospitalizations? No  Does the patient have any other associated symptoms or modifying factors? None    Hila Dayne, MA             HPI:   Patient with SOB is here for follow-up visit. At initial visit, 03/14/2023 he reported that he was treated for pneumonia 2 to 3 times in the previous months. There was a concerned for aspiration. He completed a swallow study in 11/2022 and placed on a pureed diet.  States he had a repeat swallow study that was normal, but he remains on a pureed diet.  Does note some intermittent aspiration symptoms.  Today, he states that he had aspiration pneumonia 1 month ago that was diagnosed via chest x-ray.  He states that his PCP gave him 2 rounds of antibiotics.  He reports today that he feels like he recovered.  He reports no shortness of breath, wheezing, or coughing.  He states that that the plan is to continue with just pureed  foods to prevent another aspiration event.     Patient has normal pressure hydrocephalus and tardive dyskinesia.  Had a shunt placed less than a year ago. Life long non-smoker.  CXR 01/04/2023 showed increased right base consolidation. PFT 03/14/23 showed mild restriction on both spirometry and lung volumes.  HRCT previous visit shows Bronchiectasis, greatest within the right lower lobe, with bronchial wall thickening and scarring/atelectasis, primarily within the anterior segment right lower lobe, similar to the prior study. Improvement of the bronchial wall thickening and fluid/mucus within dilated right lower lobe bronchi.      Past Medical History:  Past Medical History:   Diagnosis Date    Anxiety     Diabetes mellitus, type 2     Esophageal reflux     HTN     Hypercholesterolemia     Hyperlipidemia     Hypothyroidism     NPH (normal pressure hydrocephalus)          Past Surgical History  Past Surgical History:   Procedure Laterality Date    HX APPENDECTOMY      HX COLONOSCOPY      HX TONSILLECTOMY           Medication List  Current Outpatient  Medications   Medication Sig    aspirin 81 mg Oral Tablet, Chewable Chew 1 Tablet (81 mg total) Daily    carbidopa-levodopa (SINEMET CR) 25-100 mg Oral Tablet Sustained Release Take 2 Tablets by mouth    clonazePAM (KLONOPIN) 1 mg Oral Tablet Take 1 Tablet (1 mg total) by mouth    cloNIDine HCL (CATAPRES) 0.1 mg Oral Tablet Take 1 Tablet (0.1 mg total) by mouth    divalproex (DEPAKOTE ER) 500 mg Oral Tablet Sustained Release 24 hr 2 Tablets (1,000 mg total)    glipiZIDE (GLUCOTROL XL) 10 mg Oral Tablet Extended Rel 24 hr Take 1 Tablet (10 mg total) by mouth Daily    Levothyroxine 50 mcg Oral Capsule Take 1 Capsule (50 mcg total) by mouth    memantine (NAMENDA) 10 mg Oral Tablet Take 1 Tablet (10 mg total) by mouth Twice daily    OLANZapine (ZYPREXA) 2.5 mg Oral Tablet Take 1 Tablet (2.5 mg total) by mouth    omeprazole (PRILOSEC) 20 mg Oral Capsule, Delayed Release(E.C.)  Take 1 Capsule (20 mg total) by mouth    pravastatin (PRAVACHOL) 40 mg Oral Tablet 1 Tablet (40 mg total)     Allergy List  Allergy History as of 12/04/23       ZIPRASIDONE         Noted Status Severity Type Reaction    01/04/11 1559 Kodali, Dheeraj 01/04/11 Active  Side Effect  Other Adverse Reaction (Add comment)    Comments: Tardive dyskinesia               PALIPERIDONE         Noted Status Severity Type Reaction    06/21/11 1313 Kodali, Dheeraj 06/21/11 Active                 TAMSULOSIN         Noted Status Severity Type Reaction    03/13/23 1625 Franchot, Eleanor, MA 07/10/21 Active Medium  Rash    Comments: Per MAR 09/19/2022               ZOLPIDEM         Noted Status Severity Type Reaction    03/13/23 1625 Franchot Eleanor, MA 04/29/07 Active    Other Adverse Reaction (Add comment)    Comments: Excessively groggy     Excessively groggy                   Family History   Family Medical History:    None         Social History  Social History     Socioeconomic History    Marital status: Married   Tobacco Use    Smoking status: Never    Smokeless tobacco: Never   Vaping Use    Vaping status: Never Used   Substance and Sexual Activity    Alcohol use: No    Drug use: No    Sexual activity: Not Currently     Social Determinants of Health     Transportation Needs: No Transportation Needs (09/18/2022)    Received from [employer]     In the past 12 months, has lack of reliable transportation kept you from medical appointments, meetings, work or from getting things needed for daily living? : No   Housing Stability: Low Risk  (09/18/2022)    Received from [facility]    Housing Stability Vital Sign     What is your living situation today?:  I have a steady place to live     Think about the place you live. Do you have problems with any of the following? Choose all that apply:: None/None on this list        Review of system  General:  Denies fever, chills, night sweats, fatigue, loss of  appetite.  Neurological:  Denies headache, snoring.   Gastrointestinal:  Denies reflux, heartburn, diarrhea.  Cardiovascular:  Denies chest pain, irregular heartbeats.  Respiratory:  see above HPI  Musculoskeletal:  Denies joint pain, restless legs.  Endocrine/Metabolic:  Denies weight gain, weight loss.  Immunologic:  Denies sinus allergy symptoms.  Mental Status/Psychiatric:  Denies anxiety, depression.  ENT:  Denies nasal congestion, hoarseness, postnasal drip.  Integumentary:  Denies rash, new skin lesions.    Objective:  Vital Signs  Vitals:    12/04/23 1414   BP: (!) 122/58   Pulse: 92   Resp: 16   Temp: 36.3 C (97.4 F)   TempSrc: Temporal   SpO2: 96%   Weight: 77.1 kg (170 lb)   Height: 1.803 m (5' 11)   BMI: 23.71         PHYSICAL EXAMINATION:   Constitutional:  Vital signs stable.  General appearance of the patient:  Alert, no acute distress.  Normal appearance, well nourished.  Eyes: PERRLA and normal eye lids.  Conjunctiva normal.  Ears, Nose, Mouth, and Throat: External inspection of ears and nose with normal appearance.  Inspection of lips, teeth and gums with normal appearance and oral mucosa normal.  Neck: Supple with trachea midline, non tender, no nodules, no masses, gland position midline.  Respiratory:  Auscultation of lungs with normal breath sounds, no rales, no rhonchi, no wheezing.  Respiratory effort with no tractions, breathing regular and unlabored.  Cardiovascular:  Regular rhythm and regular rate.  No murmur, no peripheral edema.  Gastrointestinal: Abdomen non-tender, no masses, no hepatomegaly present.  Lymphatic:  No lymphadenopathy present, no supraclavicular nodes.  Musculoskeletal:  Normal gait and station, normal digits, no digital cyanosis or clubbing.  Mental Status/Psychiatric:  Alert, grossly oriented to person, place, and time.  Appropriate and normal mood.        Assessment    ICD-10-CM    1. Bronchiectasis  J47.9 CT CHEST WO IV CONTRAST      2. DOE (dyspnea on exertion)   R06.09 CT CHEST WO IV CONTRAST      3. H/O recurrent pneumonia  Z87.01             Plan  Instructions  Continue medications as prescribed/directed unless changed by provider.  Plan of care discussed with patient.    Continue pureed diet  Offered speech therapy evaluation patient declined  HRCT previous visit shows Bronchiectasis, greatest within the right lower lobe, with bronchial wall thickening and scarring/atelectasis, primarily within the anterior segment right lower lobe, similar to the prior study.  We will plan to repeat CT scan in 6 months at follow-up visit    Return in about 28 weeks (around 06/17/2024) for CT scan next visit, In Dr. Trenna pod.    The patient was given the opportunity to ask questions and those questions were answered to the patient's satisfaction. The patient was encouraged to call with any additional questions or concerns. Discussed with the patient effects and side effects of medications. Medication safety was discussed.  The patient was informed to contact the office within 7 business days if a message/lab results/referral/imaging results have not been conveyed to the  patient.    Electronically signed by Dawna Manns, APRN, CNP    _______________________________    I personally saw and evaluated the patient as part of a shared service with an APP.    My substantive findings are:  MDM (complete) I have personally managed 2 or more stable chronic illnesses (recurrent aspiration pneumonia, bronchiectasis.)  I have reviewed and independently interpreted CT images as listed below.      CT scan of the chest images done on 05/22/2023 was independently viewed:  Right lobe bronchiectasis with improvement of the fluids and mucus plugging compared to November 2021    Patient passed a swallow eval, but had recurrent pneumonia when he went back to reg diet.  Otherwise has been stable with aspiration pneumonias.  Dyspnea and bronchiectasis are stable and denies any major cough or wheezing  between pneumonia episodes.  CT does show significant bronchiectasis in the bases.  We will repeat a CT scan to ensure no progression which would indicate a superimposed atypical infection.  Follow up in 6 months with a repeat CT scan of the chest.        Elsie Marsa Gully, MD  12/04/2023 14:42    This note may have been partially generated using MModal Fluency Direct system, and there may be some incorrect words, spellings, and punctuation that were not noted in checking the note before saving.         [1]   Social History  Tobacco Use   Smoking Status Never   Smokeless Tobacco Never

## 2023-12-04 NOTE — Nursing Note (Signed)
 Is the patient currently on oxygen therapy? No  What Liter Flow is the patient on? N/A  What DME company does the patient utilize for oxygen therapy? N/A  What is the patient on? n/a  What DME company does the patient utilize for CPAP/BiPAP/Trilogy? N/A  Smoking History:    Tobacco Use History[1]   Have you received a flu shot? Yes  Have you received a pneumonia shot? Yes  Has the patient had any tests performed since the last visit? None  If so, where were the test(s) performed? N/A  Has the patient had any recent hospitalizations? No  Does the patient have any other associated symptoms or modifying factors? None  Vincent Corral, MA           [1]   Social History  Tobacco Use   Smoking Status Never   Smokeless Tobacco Never

## 2024-06-17 ENCOUNTER — Ambulatory Visit (INDEPENDENT_AMBULATORY_CARE_PROVIDER_SITE_OTHER): Payer: Self-pay

## 2024-06-17 ENCOUNTER — Ambulatory Visit (INDEPENDENT_AMBULATORY_CARE_PROVIDER_SITE_OTHER): Payer: Self-pay | Admitting: Radiology
